# Patient Record
Sex: Male | Born: 1965 | Race: White | Hispanic: No | Smoking: Never smoker
Health system: Southern US, Community
[De-identification: ages and names within clinical notes are randomized; demographics above are authoritative.]

## PROBLEM LIST (undated history)

## (undated) DIAGNOSIS — I10 Essential (primary) hypertension: Secondary | ICD-10-CM

## (undated) DIAGNOSIS — E039 Hypothyroidism, unspecified: Secondary | ICD-10-CM

## (undated) DIAGNOSIS — F419 Anxiety disorder, unspecified: Secondary | ICD-10-CM

## (undated) DIAGNOSIS — K219 Gastro-esophageal reflux disease without esophagitis: Secondary | ICD-10-CM

## (undated) DIAGNOSIS — G4730 Sleep apnea, unspecified: Secondary | ICD-10-CM

## (undated) DIAGNOSIS — F32A Depression, unspecified: Secondary | ICD-10-CM

## (undated) DIAGNOSIS — F329 Major depressive disorder, single episode, unspecified: Secondary | ICD-10-CM

## (undated) DIAGNOSIS — R51 Headache: Secondary | ICD-10-CM

## (undated) HISTORY — PX: KNEE ARTHROSCOPY: SHX127

## (undated) HISTORY — PX: CARDIAC CATHETERIZATION: SHX172

## (undated) HISTORY — PX: OTHER SURGICAL HISTORY: SHX169

## (undated) HISTORY — PX: NASAL SEPTOPLASTY W/ TURBINOPLASTY: SHX2070

---

## 1998-02-10 ENCOUNTER — Emergency Department (HOSPITAL_COMMUNITY): Admission: EM | Admit: 1998-02-10 | Discharge: 1998-02-10 | Payer: Self-pay

## 1999-01-05 ENCOUNTER — Emergency Department (HOSPITAL_COMMUNITY): Admission: EM | Admit: 1999-01-05 | Discharge: 1999-01-05 | Payer: Self-pay | Admitting: Emergency Medicine

## 1999-04-08 ENCOUNTER — Ambulatory Visit (HOSPITAL_COMMUNITY): Admission: RE | Admit: 1999-04-08 | Discharge: 1999-04-08 | Payer: Self-pay | Admitting: Cardiovascular Disease

## 2001-03-17 ENCOUNTER — Encounter: Admission: RE | Admit: 2001-03-17 | Discharge: 2001-03-17 | Payer: Self-pay | Admitting: Family Medicine

## 2001-03-17 ENCOUNTER — Encounter: Payer: Self-pay | Admitting: Family Medicine

## 2005-11-19 ENCOUNTER — Encounter: Admission: RE | Admit: 2005-11-19 | Discharge: 2005-11-19 | Payer: Self-pay | Admitting: Family Medicine

## 2006-11-04 ENCOUNTER — Encounter: Admission: RE | Admit: 2006-11-04 | Discharge: 2006-11-04 | Payer: Self-pay | Admitting: Family Medicine

## 2007-01-08 ENCOUNTER — Encounter: Admission: RE | Admit: 2007-01-08 | Discharge: 2007-01-08 | Payer: Self-pay | Admitting: Otolaryngology

## 2007-03-10 ENCOUNTER — Ambulatory Visit (HOSPITAL_COMMUNITY): Admission: RE | Admit: 2007-03-10 | Discharge: 2007-03-11 | Payer: Self-pay | Admitting: Otolaryngology

## 2008-11-17 ENCOUNTER — Inpatient Hospital Stay (HOSPITAL_COMMUNITY): Admission: EM | Admit: 2008-11-17 | Discharge: 2008-11-18 | Payer: Self-pay | Admitting: Emergency Medicine

## 2010-04-08 ENCOUNTER — Ambulatory Visit (HOSPITAL_COMMUNITY)
Admission: RE | Admit: 2010-04-08 | Discharge: 2010-04-08 | Payer: Self-pay | Source: Home / Self Care | Attending: Psychiatry | Admitting: Psychiatry

## 2010-04-15 ENCOUNTER — Other Ambulatory Visit (HOSPITAL_COMMUNITY): Payer: BC Managed Care – PPO | Attending: Psychiatry | Admitting: Psychiatry

## 2010-04-15 DIAGNOSIS — F329 Major depressive disorder, single episode, unspecified: Secondary | ICD-10-CM | POA: Insufficient documentation

## 2010-04-15 DIAGNOSIS — F3289 Other specified depressive episodes: Secondary | ICD-10-CM | POA: Insufficient documentation

## 2010-04-15 DIAGNOSIS — I10 Essential (primary) hypertension: Secondary | ICD-10-CM | POA: Insufficient documentation

## 2010-04-15 DIAGNOSIS — F411 Generalized anxiety disorder: Secondary | ICD-10-CM | POA: Insufficient documentation

## 2010-04-15 DIAGNOSIS — E7800 Pure hypercholesterolemia, unspecified: Secondary | ICD-10-CM | POA: Insufficient documentation

## 2010-04-15 DIAGNOSIS — E039 Hypothyroidism, unspecified: Secondary | ICD-10-CM | POA: Insufficient documentation

## 2010-04-16 ENCOUNTER — Other Ambulatory Visit (HOSPITAL_COMMUNITY): Payer: BC Managed Care – PPO | Admitting: Psychiatry

## 2010-04-16 DIAGNOSIS — F3289 Other specified depressive episodes: Secondary | ICD-10-CM

## 2010-04-16 DIAGNOSIS — F329 Major depressive disorder, single episode, unspecified: Secondary | ICD-10-CM

## 2010-04-16 DIAGNOSIS — F411 Generalized anxiety disorder: Secondary | ICD-10-CM

## 2010-04-17 ENCOUNTER — Other Ambulatory Visit (HOSPITAL_COMMUNITY): Payer: BC Managed Care – PPO | Admitting: Psychiatry

## 2010-04-18 ENCOUNTER — Other Ambulatory Visit (HOSPITAL_COMMUNITY): Payer: BC Managed Care – PPO | Admitting: Psychiatry

## 2010-04-19 ENCOUNTER — Other Ambulatory Visit (HOSPITAL_COMMUNITY): Payer: BC Managed Care – PPO | Admitting: Psychiatry

## 2010-04-22 ENCOUNTER — Other Ambulatory Visit (HOSPITAL_COMMUNITY): Payer: BC Managed Care – PPO | Admitting: Psychiatry

## 2010-04-23 ENCOUNTER — Other Ambulatory Visit (HOSPITAL_COMMUNITY): Payer: BC Managed Care – PPO | Admitting: Psychiatry

## 2010-04-24 ENCOUNTER — Other Ambulatory Visit (HOSPITAL_COMMUNITY): Payer: BC Managed Care – PPO | Admitting: Psychiatry

## 2010-04-25 ENCOUNTER — Other Ambulatory Visit (HOSPITAL_COMMUNITY): Payer: BC Managed Care – PPO | Admitting: Psychiatry

## 2010-04-26 ENCOUNTER — Other Ambulatory Visit (HOSPITAL_COMMUNITY): Payer: BC Managed Care – PPO | Admitting: Psychiatry

## 2010-04-29 ENCOUNTER — Other Ambulatory Visit (HOSPITAL_COMMUNITY): Payer: BC Managed Care – PPO | Admitting: Psychiatry

## 2010-04-30 ENCOUNTER — Other Ambulatory Visit (HOSPITAL_COMMUNITY): Payer: BC Managed Care – PPO | Admitting: Psychiatry

## 2010-05-01 ENCOUNTER — Other Ambulatory Visit (HOSPITAL_COMMUNITY): Payer: BC Managed Care – PPO | Admitting: Psychiatry

## 2010-05-02 ENCOUNTER — Other Ambulatory Visit (HOSPITAL_COMMUNITY): Payer: BC Managed Care – PPO | Admitting: Psychiatry

## 2010-05-03 ENCOUNTER — Other Ambulatory Visit (HOSPITAL_COMMUNITY): Payer: BC Managed Care – PPO | Admitting: Psychiatry

## 2010-06-14 LAB — TYPE AND SCREEN

## 2010-06-14 LAB — CBC
HCT: 34.8 % — ABNORMAL LOW (ref 39.0–52.0)
Hemoglobin: 12 g/dL — ABNORMAL LOW (ref 13.0–17.0)
RBC: 3.7 MIL/uL — ABNORMAL LOW (ref 4.22–5.81)
WBC: 14.8 10*3/uL — ABNORMAL HIGH (ref 4.0–10.5)

## 2010-06-14 LAB — BASIC METABOLIC PANEL
BUN: 17 mg/dL (ref 6–23)
GFR calc Af Amer: >60 mL/min (ref >60)
GFR calc non Af Amer: >60 mL/min (ref >60)
Potassium: 3.9 mEq/L (ref 3.5–5.1)
Sodium: 137 mEq/L (ref 135–145)

## 2010-06-14 LAB — DIFFERENTIAL
Basophils Absolute: 0 10*3/uL (ref 0.0–0.1)
Eosinophils Relative: 1 % (ref 0–5)
Lymphocytes Relative: 12 % (ref 12–46)
Lymphs Abs: 1.8 10*3/uL (ref 0.7–4.0)
Monocytes Absolute: 1.5 10*3/uL — ABNORMAL HIGH (ref 0.1–1.0)
Monocytes Relative: 10 % (ref 3–12)
Neutro Abs: 11.4 10*3/uL — ABNORMAL HIGH (ref 1.7–7.7)

## 2010-06-14 LAB — ABO/RH: ABO/RH(D): O POS

## 2010-07-23 NOTE — Op Note (Signed)
NAME:  [name], [name] NO.:  [id number]   MEDICAL RECORD NO.:  [id number]          PATIENT TYPE:  AMB   LOCATION:  SDS                          FACILITY:  [facility]   PHYSICIAN:  Kinnie Scales. Annalee Genta, M.D.DATE OF BIRTH:  30-Mar-1965   DATE OF PROCEDURE:  03/10/2007  DATE OF DISCHARGE:                               OPERATIVE REPORT   PREOPERATIVE DIAGNOSIS:  1. Deviated nasal septum with nasal airway obstruction.  2. Inferior turbinate hypertrophy.  3. Obstructive sleep apnea.   POSTOPERATIVE DIAGNOSIS:  1. Deviated nasal septum with nasal airway obstruction.  2. Inferior turbinate hypertrophy.  3. Obstructive sleep apnea.   SURGICAL PROCEDURES:  1. Nasal septoplasty.  2. Bilateral inferior turbinate reduction.   SURGEON:  Kinnie Scales. Annalee Genta, M.D.   ANESTHESIA:  General endotracheal.   COMPLICATIONS:  None.   ESTIMATED BLOOD LOSS:  Less than 50 mL.   DISPOSITION:  The patient was transferred from the operating room to the  recovery room in stable condition.   BRIEF HISTORY:  Mr. Mark Good is a 45 year old white male who is referred for  evaluation of severe obstructive sleep apnea.  He was found to have a  deviated nasal septum with nasal airway obstruction and bilateral  turbinate hypertrophy.  He has been attempting to wear CPAP on an  ongoing basis, but is having significant difficulty with nasal pressure  and congestion.  Given his history and physical findings, I recommended  he undertake nasal septoplasty and turbinate reduction.  The risks,  benefits, and possible complications of the surgical procedure were  discussed in detail with the patient who understood and concurred with  our plan for surgery which was scheduled as an outpatient under general  anesthesia at [facility] on March 10, 2007. The patient was  planned surgery in the main OR with overnight observation in the step  down unit for monitoring of sleep apnea.  The patient and his  family  understood and concurred with our plan which is scheduled as above.   PROCEDURE:  The patient was brought to the operating room on March 10, 2007, and placed in the supine position on the operating table.  General endotracheal anesthesia was established without difficulty.  When the patient was adequately anesthetized, her was positioned on the  operating room table and prepped and draped in a sterile fashion.  His  nose was injected with 8 mL of 1% lidocaine with 1:100,000 epinephrine,  injected in a submucosal fashion along the nasal septum and inferior  turbinates bilaterally.  The patient's nose was then packed with Afrin  soaked cottonoid pledgets which were left in place for approximately ten  minutes allowing for vasoconstriction and hemostasis.  The patient was  then positioned and draped and the surgical procedure was begun.   The packing was removed and a right anterior hemitransfixion incision  was created. A mucoperichondrial flap was elevated along the patient's  right hand side. The bony cartilaginous junction was crossed and a  mucoperiosteal flap was elevated along the patient's left hand side.  Deviated bone and cartilage were then  mobilized and removed in the mid  and posterior aspects of the nasal septum.  The patient had a large  nasal septal spur which was mobilized with a 4 mm osteotome, preserving  the overlying mucosa. Anterior dorsal and columellar cartilage was not  deviated and was left intact.  The mid septal cartilage was morselized  and returned to the mucoperichondrial pocket and the flaps were  reapproximated with a 4-0 gut suture on a Keith needle in horizontal  mattress fashion.  Bilateral Doyle nasal septal splints were then placed  after the application of Bactroban ointment and were sutured in position  with a 3-0 Ethilon suture.   Attention was then turned to the inferior turbinates where bilateral  inferior turbinate reduction was  performed. With bipolar intramural  cautery set at 12 watts, two submucosal passes were made in each  inferior turbinate.  When the turbinates had been adequately cauterized,  they were outfractured creating a more patent nasal cavity. Small  anterior incisions were created and the anterior turbinate bone was  resected preserving the overlying mucosa.  The nasal cavity was then  irrigated and suctioned.  An orogastric tube was passed and the stomach  contents were aspirated.  The patient was then awakened from his  anesthetic, he was extubated and transferred from the operating room to  the recovery room in stable condition.  No complications.  Blood loss  less than 50 mL.           ______________________________  Kinnie Scales. Annalee Genta, M.D.     DLS/MEDQ  D:  57/[id number]  T:  03/10/2007  Job:  [id number]

## 2010-12-13 LAB — BASIC METABOLIC PANEL
Chloride: 101
Creatinine, Ser: 1.01
GFR calc Af Amer: >60
Sodium: 136

## 2010-12-13 LAB — CBC
Hemoglobin: 11.4 — ABNORMAL LOW
MCV: 77.1 — ABNORMAL LOW
RBC: 4.42
WBC: 6.1

## 2011-02-12 NOTE — H&P (Signed)
  [facility] [address]   [address] [place], [place] [address] [phone] A Division of [facility]  Loreta Ave, M.D.     Robert A. Thurston Hole, M.D.     Lunette Stands, M.D. Eulas Post, M.D.    Buford Dresser, M.D. Estell Harpin, M.D. Ralene Cork, D.O.          Genene Churn. Barry Dienes, PA-C            Kirstin A. Shepperson, PA-C Janace Litten, OPA-C   RE: Mark Good, Mark Good   [id number]      DOB: 1965/09/01 PROGRESS NOTE: 01-28-11 45 year old white male seen at request of Dr. Charlett Blake for his right knee. He has worsening right knee pain locking and catching since February. Temporary improvement with Depo-Medrol/Marcaine injection. MRI on 11/10 showed mild tricompartmental degenerative changes intact ligamentous structures no bony findings, complex peripheral tear posterior horn of the lateral meniscus and moderate sized Baker's cyst. He's ready to schedule arthroscopy with debridement.  EXAMINATION: Alert and oriented x3 in no acute distress. Gait is somewhat antalgic. Right knee good range of motion some swelling with 1+ effusion. Lateral greater than medial joint line tenderness.  Positive McMurray's. Cruciate and collateral ligaments stable. Calf non-tender neurovascularly intact.  Skin warm and dry. No increase in respiratory effort.   IMPRESSION: Right knee pain and mechanical symptoms secondary to lateral meniscus tear.  DISPOSITION: We'll schedule left knee arthroscopy with debridement and partial lateral meniscectomy. Discussed risks benefits and possible complications in detail. Recommended he be out of work for 3-4 weeks if possible. He asks to return sooner but I advise against this. All questions answered.  Loreta Ave, M.D.  Electronically verified by Loreta Ave, M.D. DFM(JMO):kh D 02-03-11 T 02-03-11

## 2011-02-13 ENCOUNTER — Ambulatory Visit (HOSPITAL_BASED_OUTPATIENT_CLINIC_OR_DEPARTMENT_OTHER): Payer: BC Managed Care – PPO | Admitting: Anesthesiology

## 2011-02-13 ENCOUNTER — Encounter (HOSPITAL_BASED_OUTPATIENT_CLINIC_OR_DEPARTMENT_OTHER): Payer: Self-pay | Admitting: Anesthesiology

## 2011-02-13 ENCOUNTER — Ambulatory Visit (HOSPITAL_BASED_OUTPATIENT_CLINIC_OR_DEPARTMENT_OTHER)
Admission: RE | Admit: 2011-02-13 | Discharge: 2011-02-13 | Disposition: A | Discharge status: Home / Self Care | Payer: BC Managed Care – PPO | Source: Ambulatory Visit | Attending: Orthopedic Surgery | Admitting: Orthopedic Surgery

## 2011-02-13 ENCOUNTER — Other Ambulatory Visit: Payer: Self-pay

## 2011-02-13 ENCOUNTER — Encounter (HOSPITAL_BASED_OUTPATIENT_CLINIC_OR_DEPARTMENT_OTHER)
Admission: RE | Disposition: A | Discharge status: Home / Self Care | Payer: Self-pay | Source: Ambulatory Visit | Attending: Orthopedic Surgery

## 2011-02-13 ENCOUNTER — Encounter (HOSPITAL_BASED_OUTPATIENT_CLINIC_OR_DEPARTMENT_OTHER): Payer: Self-pay | Admitting: *Deleted

## 2011-02-13 ENCOUNTER — Encounter (HOSPITAL_BASED_OUTPATIENT_CLINIC_OR_DEPARTMENT_OTHER): Payer: Self-pay | Admitting: Orthopedic Surgery

## 2011-02-13 DIAGNOSIS — M23359 Other meniscus derangements, posterior horn of lateral meniscus, unspecified knee: Secondary | ICD-10-CM | POA: Insufficient documentation

## 2011-02-13 DIAGNOSIS — Z4789 Encounter for other orthopedic aftercare: Secondary | ICD-10-CM

## 2011-02-13 HISTORY — DX: Major depressive disorder, single episode, unspecified: F32.9

## 2011-02-13 HISTORY — DX: Anxiety disorder, unspecified: F41.9

## 2011-02-13 HISTORY — DX: Sleep apnea, unspecified: G47.30

## 2011-02-13 HISTORY — DX: Depression, unspecified: F32.A

## 2011-02-13 HISTORY — DX: Hypothyroidism, unspecified: E03.9

## 2011-02-13 HISTORY — DX: Essential (primary) hypertension: I10

## 2011-02-13 LAB — POCT I-STAT, CHEM 8
Chloride: 99 mEq/L (ref 96–112)
Glucose, Bld: 119 mg/dL — ABNORMAL HIGH (ref 70–99)
HCT: 45 % (ref 39.0–52.0)
Potassium: 3.6 mEq/L (ref 3.5–5.1)

## 2011-02-13 SURGERY — ARTHROSCOPY, KNEE, WITH MEDIAL MENISCECTOMY
Anesthesia: General | Site: Knee | Laterality: Right | Wound class: Clean

## 2011-02-13 MED ORDER — LACTATED RINGERS IV SOLN
INTRAVENOUS | Status: DC
  Administered 2011-02-13: 07:00:00 via INTRAVENOUS

## 2011-02-13 MED ORDER — PROMETHAZINE HCL 25 MG/ML IJ SOLN: 6.2500 mg | INTRAMUSCULAR | Status: DC | PRN

## 2011-02-13 MED ORDER — FENTANYL CITRATE 0.05 MG/ML IJ SOLN
INTRAMUSCULAR | Status: DC | PRN
  Administered 2011-02-13: 100 ug via INTRAVENOUS

## 2011-02-13 MED ORDER — DEXAMETHASONE SODIUM PHOSPHATE 4 MG/ML IJ SOLN
INTRAMUSCULAR | Status: DC | PRN
  Administered 2011-02-13: 10 mg via INTRAVENOUS

## 2011-02-13 MED ORDER — KETOROLAC TROMETHAMINE 30 MG/ML IJ SOLN
INTRAMUSCULAR | Status: DC | PRN
  Administered 2011-02-13: 30 mg via INTRAVENOUS

## 2011-02-13 MED ORDER — FENTANYL CITRATE 0.05 MG/ML IJ SOLN: 25.0000 ug | INTRAMUSCULAR | Status: DC | PRN

## 2011-02-13 MED ORDER — SUCCINYLCHOLINE CHLORIDE 20 MG/ML IJ SOLN
INTRAMUSCULAR | Status: DC | PRN
  Administered 2011-02-13: 100 mg via INTRAVENOUS

## 2011-02-13 MED ORDER — MIDAZOLAM HCL 2 MG/2ML IJ SOLN: 0.5000 mg | INTRAMUSCULAR | Status: DC | PRN

## 2011-02-13 MED ORDER — METHYLPREDNISOLONE ACETATE 80 MG/ML IJ SUSP
INTRAMUSCULAR | Status: DC | PRN
  Administered 2011-02-13: 80 mg via INTRA_ARTICULAR

## 2011-02-13 MED ORDER — MEPERIDINE HCL 25 MG/ML IJ SOLN: 6.2500 mg | INTRAMUSCULAR | Status: DC | PRN

## 2011-02-13 MED ORDER — LIDOCAINE HCL (CARDIAC) 20 MG/ML IV SOLN
INTRAVENOUS | Status: DC | PRN
  Administered 2011-02-13: 50 mg via INTRAVENOUS

## 2011-02-13 MED ORDER — MIDAZOLAM HCL 5 MG/5ML IJ SOLN
INTRAMUSCULAR | Status: DC | PRN
  Administered 2011-02-13: 2 mg via INTRAVENOUS

## 2011-02-13 MED ORDER — BUPIVACAINE HCL (PF) 0.5 % IJ SOLN
INTRAMUSCULAR | Status: DC | PRN
  Administered 2011-02-13: 20 mL

## 2011-02-13 MED ORDER — ONDANSETRON HCL 4 MG/2ML IJ SOLN
INTRAMUSCULAR | Status: DC | PRN
  Administered 2011-02-13: 4 mg via INTRAVENOUS

## 2011-02-13 MED ORDER — CEFAZOLIN SODIUM 1-5 GM-% IV SOLN
1.0000 g | INTRAVENOUS | Status: DC
  Administered 2011-02-13: 2 g via INTRAVENOUS

## 2011-02-13 MED ORDER — CHLORHEXIDINE GLUCONATE 4 % EX LIQD: 60.0000 mL | Freq: Once | CUTANEOUS | Status: DC

## 2011-02-13 MED ORDER — SODIUM CHLORIDE 0.9 % IR SOLN
Status: DC | PRN
  Administered 2011-02-13: 3000 mL

## 2011-02-13 MED ORDER — CEFAZOLIN SODIUM-DEXTROSE 2-3 GM-% IV SOLR: 2.0000 g | INTRAVENOUS | Status: DC

## 2011-02-13 MED ORDER — FENTANYL CITRATE 0.05 MG/ML IJ SOLN
50.0000 ug | INTRAMUSCULAR | Status: DC | PRN
  Administered 2011-02-13: 50 ug via INTRAVENOUS

## 2011-02-13 MED ORDER — PROPOFOL 10 MG/ML IV EMUL
INTRAVENOUS | Status: DC | PRN
  Administered 2011-02-13: 200 mg via INTRAVENOUS
  Administered 2011-02-13 (×2): 50 mg via INTRAVENOUS

## 2011-02-13 SURGICAL SUPPLY — 41 items
BANDAGE ELASTIC 6 VELCRO ST LF (GAUZE/BANDAGES/DRESSINGS) ×2 IMPLANT
BLADE CUDA 5.5 (BLADE) IMPLANT
BLADE CUDA GRT WHITE 3.5 (BLADE) IMPLANT
BLADE CUTTER GATOR 3.5 (BLADE) ×2 IMPLANT
BLADE CUTTER MENIS 5.5 (BLADE) IMPLANT
BLADE GREAT WHITE 4.2 (BLADE) ×2 IMPLANT
BUR OVAL 4.0 (BURR) IMPLANT
CANISTER OMNI JUG 16 LITER (MISCELLANEOUS) ×2 IMPLANT
CANISTER SUCTION 2500CC (MISCELLANEOUS) IMPLANT
CLOTH BEACON ORANGE TIMEOUT ST (SAFETY) ×2 IMPLANT
CUTTER MENISCUS  4.2MM (BLADE)
CUTTER MENISCUS 4.2MM (BLADE) IMPLANT
DRAPE ARTHROSCOPY W/POUCH 90 (DRAPES) ×2 IMPLANT
DRSG PAD ABDOMINAL 8X10 ST (GAUZE/BANDAGES/DRESSINGS) ×1 IMPLANT
DURAPREP 26ML APPLICATOR (WOUND CARE) ×2 IMPLANT
ELECT MENISCUS 165MM 90D (ELECTRODE) ×1 IMPLANT
ELECT REM PT RETURN 9FT ADLT (ELECTROSURGICAL) ×2
ELECTRODE REM PT RTRN 9FT ADLT (ELECTROSURGICAL) IMPLANT
GAUZE XEROFORM 1X8 LF (GAUZE/BANDAGES/DRESSINGS) ×2 IMPLANT
GLOVE BIO SURGEON STRL SZ 6.5 (GLOVE) ×2 IMPLANT
GLOVE BIOGEL PI IND STRL 7.0 (GLOVE) IMPLANT
GLOVE BIOGEL PI IND STRL 8 (GLOVE) ×1 IMPLANT
GLOVE BIOGEL PI INDICATOR 7.0 (GLOVE) ×1
GLOVE BIOGEL PI INDICATOR 8 (GLOVE) ×1
GLOVE ORTHO TXT STRL SZ7.5 (GLOVE) ×4 IMPLANT
GOWN BRE IMP PREV XXLGXLNG (GOWN DISPOSABLE) ×2 IMPLANT
GOWN PREVENTION PLUS XLARGE (GOWN DISPOSABLE) ×3 IMPLANT
HOLDER KNEE FOAM BLUE (MISCELLANEOUS) ×2 IMPLANT
KNEE WRAP E Z 3 GEL PACK (MISCELLANEOUS) ×1 IMPLANT
PACK ARTHROSCOPY DSU (CUSTOM PROCEDURE TRAY) ×2 IMPLANT
PACK BASIN DAY SURGERY FS (CUSTOM PROCEDURE TRAY) ×2 IMPLANT
PADDING WEBRIL 4 STERILE (GAUZE/BANDAGES/DRESSINGS) ×1 IMPLANT
PENCIL BUTTON HOLSTER BLD 10FT (ELECTRODE) ×1 IMPLANT
SET ARTHROSCOPY TUBING (MISCELLANEOUS) ×2
SET ARTHROSCOPY TUBING LN (MISCELLANEOUS) ×1 IMPLANT
SPONGE GAUZE 4X4 12PLY (GAUZE/BANDAGES/DRESSINGS) ×4 IMPLANT
SPONGE GAUZE 4X4 16PLY NONSTR (GAUZE/BANDAGES/DRESSINGS) ×1 IMPLANT
SUT ETHILON 3 0 PS 1 (SUTURE) ×2 IMPLANT
SUT VIC AB 3-0 FS2 27 (SUTURE) IMPLANT
TOWEL OR 17X24 6PK STRL BLUE (TOWEL DISPOSABLE) ×2 IMPLANT
WATER STERILE IRR 1000ML POUR (IV SOLUTION) ×2 IMPLANT

## 2011-02-13 NOTE — Anesthesia Procedure Notes (Addendum)
Procedure Name: LMA Insertion Performed by: Sharyne Richters Pre-anesthesia Checklist: Patient identified, Timeout performed, Emergency Drugs available, Suction available and Patient being monitored Patient Re-evaluated:Patient Re-evaluated prior to inductionOxygen Delivery Method: Circle System Utilized Preoxygenation: Pre-oxygenation with 100% oxygen Intubation Type: IV induction Ventilation: Mask ventilation with difficulty LMA: LMA inserted and LMA with gastric port inserted LMA Size: 4.0 Number of attempts: 1 Placement Confirmation: breath sounds checked- equal and bilateral and positive ETCO2 Dental Injury: Teeth and Oropharynx as per pre-operative assessment     Procedure Name: Intubation Performed by: Sharyne Richters Preoxygenation: Pre-oxygenation with 100% oxygen Grade View: Grade I Tube type: Oral Number of attempts: 1 Airway Equipment and Method: video-laryngoscopy Placement Confirmation: ETT inserted through vocal cords under direct vision,  breath sounds checked- equal and bilateral and positive ETCO2 Dental Injury: Teeth and Oropharynx as per pre-operative assessment     Performed by: Sharyne Richters Secured at: 23 cm Tube secured with: Tape

## 2011-02-13 NOTE — Anesthesia Preprocedure Evaluation (Signed)
Anesthesia Evaluation  Patient identified by MRN, date of birth, ID band Patient awake    Reviewed: Allergy & Precautions, H&P , NPO status , Patient's Chart, lab work & pertinent test results  Airway Mallampati: III TM Distance: >3 FB Neck ROM: full    Dental No notable dental hx. (+) Teeth Intact   Pulmonary neg pulmonary ROS, sleep apnea and Continuous Positive Airway Pressure Ventilation ,  clear to auscultation  Pulmonary exam normal       Cardiovascular hypertension, On Medications neg cardio ROS regular Normal    Neuro/Psych Negative Neurological ROS  Negative Psych ROS   GI/Hepatic negative GI ROS, Neg liver ROS,   Endo/Other  Negative Endocrine ROS  Renal/GU negative Renal ROS  Genitourinary negative   Musculoskeletal   Abdominal   Peds  Hematology negative hematology ROS (+)   Anesthesia Other Findings   Reproductive/Obstetrics negative OB ROS                           Anesthesia Physical Anesthesia Plan  ASA: III  Anesthesia Plan: General   Post-op Pain Management:    Induction: Intravenous  Airway Management Planned: LMA  Additional Equipment:   Intra-op Plan:   Post-operative Plan: Extubation in OR  Informed Consent: I have reviewed the patients History and Physical, chart, labs and discussed the procedure including the risks, benefits and alternatives for the proposed anesthesia with the patient or authorized representative who has indicated his/her understanding and acceptance.     Plan Discussed with: CRNA  Anesthesia Plan Comments:         Anesthesia Quick Evaluation

## 2011-02-13 NOTE — Interval H&P Note (Signed)
History and Physical Interval Note:  02/13/2011 7:40 AM  Mark Good  has presented today for surgery, with the diagnosis of MEDIAL MENISCUS TEAR  The various methods of treatment have been discussed with the patient and family. After consideration of risks, benefits and other options for treatment, the patient has consented to  Procedure(s): KNEE ARTHROSCOPY WITH MEDIAL MENISECTOMY as a surgical intervention .  The patients' history has been reviewed, patient examined, no change in status, stable for surgery.  I have reviewed the patients' chart and labs.  Questions were answered to the patient's satisfaction.     Kye Hedden F

## 2011-02-13 NOTE — Transfer of Care (Signed)
Immediate Anesthesia Transfer of Care Note  Patient: Mark Good  Procedure(s) Performed:  KNEE ARTHROSCOPY WITH MEDIAL MENISECTOMY - RIGHT KNEE ARTHROSCOPY WITH LATERAL MENISECTOMY excsion of plica  Patient Location: PACU  Anesthesia Type: General  Level of Consciousness: awake and alert   Airway & Oxygen Therapy: Patient Spontanous Breathing and Patient connected to face mask oxygen  Post-op Assessment: Report given to PACU RN and Post -op Vital signs reviewed and stable  Post vital signs: Reviewed and stable  Complications: No apparent anesthesia complications

## 2011-02-13 NOTE — Brief Op Note (Signed)
02/13/2011  8:45 AM  PATIENT:  Mark Good  45 y.o. male  PRE-OPERATIVE DIAGNOSIS:  LATERAL MENISCUS TEAR right knee  POST-OPERATIVE DIAGNOSIS:  same as preop plus plica  PROCEDURE:  Procedure(s): Right KNEE ARTHROSCOPY WITH MEDIAL MENISECTOMY  SURGEON:  Surgeon(s): Loreta Ave, MD  PHYSICIAN ASSISTANT: Zonia Kief M     ANESTHESIA:   general  EBL:  Total I/O In: 1000 [I.V.:1000] Out: -    SPECIMEN:  No Specimen  DISPOSITION OF SPECIMEN:  N/A  COUNTS:  YES  TOURNIQUET:  * No tourniquets in log *    PATIENT DISPOSITION:  PACU - hemodynamically stable.

## 2011-02-13 NOTE — Interval H&P Note (Signed)
History and Physical Interval Note:  02/13/2011 7:49 AM  Mark Good  has presented today for surgery, with the diagnosis of MEDIAL MENISCUS TEAR  The various methods of treatment have been discussed with the patient and family. After consideration of risks, benefits and other options for treatment, the patient has consented to  Procedure(s): KNEE ARTHROSCOPY WITH MEDIAL MENISECTOMY as a surgical intervention .  The patients' history has been reviewed, patient examined, no change in status, stable for surgery.  I have reviewed the patients' chart and labs.  Questions were answered to the patient's satisfaction.     Harmon Bommarito F

## 2011-02-13 NOTE — Anesthesia Postprocedure Evaluation (Signed)
  Anesthesia Post-op Note  Patient: Mark Good  Procedure(s) Performed:  KNEE ARTHROSCOPY WITH MEDIAL MENISECTOMY - RIGHT KNEE ARTHROSCOPY WITH LATERAL MENISECTOMY excsion of plica  Patient Location: PACU  Anesthesia Type: General  Level of Consciousness: awake and alert   Airway and Oxygen Therapy: Patient Spontanous Breathing and Patient connected to face mask oxygen  Post-op Pain: none  Post-op Assessment: Post-op Vital signs reviewed, Patient's Cardiovascular Status Stable, Respiratory Function Stable, Patent Airway and No signs of Nausea or vomiting  Post-op Vital Signs: Reviewed and stable  Complications: No apparent anesthesia complications

## 2011-02-14 NOTE — Op Note (Signed)
NAME:  Mark Good, TOOKER NO.:  [id number]  MEDICAL RECORD NO.:  [id number]  LOCATION:                                 FACILITY:  PHYSICIAN:  Loreta Ave, M.D.      DATE OF BIRTH:  DATE OF PROCEDURE:  02/13/2011 DATE OF DISCHARGE:                              OPERATIVE REPORT   PREOPERATIVE DIAGNOSIS:  Right knee lateral meniscus tear.  POSTOPERATIVE DIAGNOSES:  Right knee lateral meniscus tear with also __________ fibrotic medial plica.  PROCEDURE:  Right knee exam under anesthesia, arthroscopy.  Partial lateral meniscectomy.  Excision medial plica.  SURGEON:  Loreta Ave, MD  ASSISTANT:  Zonia Kief, PA  ANESTHESIA:  General.  BLOOD LOSS:  Minimal.  SPECIMENS:  None.  CULTURES:  None.  COMPLICATION:  None.  DRESSINGS:  Sterile compressive.  TOURNIQUET:  Not employed.  PROCEDURE:  The patient was brought to the operating room and placed on the operating table in supine position.  After adequate anesthesia had been obtained, tourniquet and leg holder applied,  leg prepped and draped in usual sterile fashion.  Full motion, good stability.  Two portals, 1 each medial and lateral parapatellar.  Arthroscope was introduced, knee distended and inspected.  Large fibrotic medial plica extending halfway across patellofemoral joint with chondral changes on the condyle below.  Plica excised, chondroplasty on the margin of the condyle.  Hemostasis with cautery.  Medial meniscus and medial compartment normal.  ACL some attenuation stretching but intact. Lateral meniscus extensive complex tearing entire posterior half, nothing reparable.  Posterior half removed, tapered in remaining meniscus.  The entire knee examined and no other findings appreciated.  Instruments and fluid were removed.  Portals of knee injected with Marcaine.  Portals closed with 4-0 nylon.  Sterile compressive dressing applied.  Anesthesia reversed.  Brought to the recovery room.   Tolerated the surgery well.  No complications.     Loreta Ave, M.D.     DFM/MEDQ  D:  02/13/2011  T:  02/13/2011  Job:  [id number]

## 2011-11-03 ENCOUNTER — Encounter (HOSPITAL_COMMUNITY): Payer: Self-pay | Admitting: Family Medicine

## 2011-11-03 ENCOUNTER — Emergency Department (HOSPITAL_COMMUNITY)
Admission: EM | Admit: 2011-11-03 | Discharge: 2011-11-04 | Disposition: A | Discharge status: Home / Self Care | Payer: BC Managed Care – PPO | Attending: Emergency Medicine | Admitting: Emergency Medicine

## 2011-11-03 ENCOUNTER — Emergency Department (HOSPITAL_COMMUNITY): Payer: BC Managed Care – PPO

## 2011-11-03 DIAGNOSIS — F29 Unspecified psychosis not due to a substance or known physiological condition: Secondary | ICD-10-CM | POA: Insufficient documentation

## 2011-11-03 DIAGNOSIS — R0602 Shortness of breath: Secondary | ICD-10-CM | POA: Insufficient documentation

## 2011-11-03 DIAGNOSIS — Z79899 Other long term (current) drug therapy: Secondary | ICD-10-CM | POA: Insufficient documentation

## 2011-11-03 DIAGNOSIS — R51 Headache: Secondary | ICD-10-CM | POA: Insufficient documentation

## 2011-11-03 DIAGNOSIS — R5381 Other malaise: Secondary | ICD-10-CM | POA: Insufficient documentation

## 2011-11-03 DIAGNOSIS — R079 Chest pain, unspecified: Secondary | ICD-10-CM | POA: Insufficient documentation

## 2011-11-03 DIAGNOSIS — F3289 Other specified depressive episodes: Secondary | ICD-10-CM | POA: Insufficient documentation

## 2011-11-03 DIAGNOSIS — E039 Hypothyroidism, unspecified: Secondary | ICD-10-CM | POA: Insufficient documentation

## 2011-11-03 DIAGNOSIS — I10 Essential (primary) hypertension: Secondary | ICD-10-CM | POA: Insufficient documentation

## 2011-11-03 DIAGNOSIS — F329 Major depressive disorder, single episode, unspecified: Secondary | ICD-10-CM | POA: Insufficient documentation

## 2011-11-03 LAB — COMPREHENSIVE METABOLIC PANEL
ALT: 25 U/L (ref 0–53)
Alkaline Phosphatase: 50 U/L (ref 39–117)
CO2: 27 mEq/L (ref 19–32)
Calcium: 10 mg/dL (ref 8.4–10.5)
GFR calc Af Amer: >90 mL/min (ref >90)
GFR calc non Af Amer: >90 mL/min (ref >90)
Glucose, Bld: 95 mg/dL (ref 70–99)
Sodium: 134 mEq/L — ABNORMAL LOW (ref 135–145)

## 2011-11-03 LAB — CBC
Hemoglobin: 13.5 g/dL (ref 13.0–17.0)
MCH: 30.1 pg (ref 26.0–34.0)
RBC: 4.49 MIL/uL (ref 4.22–5.81)

## 2011-11-03 LAB — POCT I-STAT TROPONIN I: Troponin i, poc: 0 ng/mL (ref 0.00–0.08)

## 2011-11-03 MED ORDER — IBUPROFEN 800 MG PO TABS
800.0000 mg | ORAL_TABLET | Freq: Once | ORAL | Status: AC
  Administered 2011-11-03: 800 mg via ORAL
  Filled 2011-11-03: qty 1

## 2011-11-03 NOTE — ED Provider Notes (Signed)
History     CSN: [id number]  Arrival date & time 11/03/11  1811   First MD Initiated Contact with Patient 11/03/11 2208      Chief Complaint  Patient presents with  . Chest Pain  . Altered Mental Status  . Headache   HPI  History provided by patient and family. Patient is a 46 year old male with history of hypertension, hypothyroidism, anxiety and depression who presents with multiple symptoms of chest pain, headache, general weakness and fatigue, confusion and memory problems, loss of interest. Patient states that symptoms began earlier this month on the 14th. Patient complains of intermittent chest heaviness and sharp pains. Pain most often occurs at night when lying down but also occasionally with activity. Chest pain symptoms can be associated with shortness of breath or heaviness with breathing. Patient also reports having increased stress with work which has caused him to be more depressed. Patient history of her major depression and has seen psychiatrists several times over the past 2 weeks. He was sent to the emergency room because of his multiple symptoms and complaints to rule out any medical problems. Patient has not had any changes in medications. He has been taking medicines normally as prescribed. Patient is not smoke tobacco or use alcohol. He denies any other drug use. Patient does state that he made appointment with cardiologist which is in September. He has never had stress tests or other cardiac evaluation. He does have family history of diabetes, psychiatric issues, hypertension and congestive heart failure.    Past Medical History  Diagnosis Date  . Hypertension   . Depression   . Anxiety   . Hypothyroidism   . Sleep apnea     Past Surgical History  Procedure Date  . Nasal septoplasty w/ turbinoplasty   . Left leg repair of laceration     History reviewed. No pertinent family history.  History  Substance Use Topics  . Smoking status: Never Smoker   .  Smokeless tobacco: Never Used  . Alcohol Use: No      Review of Systems  Constitutional: Positive for fatigue. Negative for fever and chills.  Respiratory: Positive for cough and shortness of breath.   Cardiovascular: Positive for chest pain. Negative for palpitations.       Feet and hand swelling  Gastrointestinal: Negative for nausea, vomiting, abdominal pain, diarrhea and constipation.  Neurological: Positive for headaches. Negative for dizziness, facial asymmetry, speech difficulty, weakness, light-headedness and numbness.  Psychiatric/Behavioral: Positive for confusion and dysphoric mood. Negative for suicidal ideas and disturbed wake/sleep cycle.    Allergies  Review of patient's allergies indicates no known allergies.  Home Medications   Current Outpatient Rx  Name Route Sig Dispense Refill  . ALPRAZOLAM 1 MG PO TABS Oral Take 1 mg by mouth 3 (three) times daily as needed. For stress/anxiety    . GOODY HEADACHE PO Oral Take 1 packet by mouth 4 (four) times daily as needed. For headache    . BC HEADACHE PO Oral Take 1 packet by mouth 4 (four) times daily as needed. For headache    . ATENOLOL-CHLORTHALIDONE 100-25 MG PO TABS Oral Take 1 tablet by mouth daily.      Marland Kitchen VITAMIN B COMPLEX PO Oral Take 1 tablet by mouth daily.    Marland Kitchen VITAMIN D PO Oral Take 2 capsules by mouth daily.    . DULOXETINE HCL 60 MG PO CPEP Oral Take 60 mg by mouth daily.     Marland Kitchen LEVOTHYROXINE SODIUM 112 MCG  PO TABS Oral Take 112 mcg by mouth daily.      Marland Kitchen NIACIN ER (ANTIHYPERLIPIDEMIC) 1000 MG PO TBCR Oral Take 1,500 mg by mouth at bedtime.      Marland Kitchen FISH OIL PO Oral Take 2 capsules by mouth every evening.    Marland Kitchen SIMVASTATIN 40 MG PO TABS Oral Take 40 mg by mouth at bedtime.       BP 130/73  Pulse 60  Temp 98.2 F (36.8 C) (Oral)  Resp 20  SpO2 97%  Physical Exam  Nursing note and vitals reviewed. Constitutional: He is oriented to person, place, and time. He appears well-developed and well-nourished. No  distress.  HENT:  Head: Normocephalic and atraumatic.  Eyes: Conjunctivae and EOM are normal. Pupils are equal, round, and reactive to light.  Neck: Normal range of motion. Neck supple.  Cardiovascular: Normal rate and regular rhythm.   No murmur heard. Pulmonary/Chest: Effort normal and breath sounds normal. No respiratory distress. He has no wheezes. He has no rales. He exhibits no tenderness.  Abdominal: Soft. There is no tenderness. There is no rebound and no guarding.  Musculoskeletal: Normal range of motion. He exhibits no edema and no tenderness.       No appreciable swelling of the feet or hands. Normal pulses. Normal movements and extremity exams.  Neurological: He is alert and oriented to person, place, and time. He has normal strength. No cranial nerve deficit or sensory deficit. Coordination normal.       Patient with some slow responses.  Skin: Skin is warm and dry. No rash noted. No erythema.  Psychiatric: His behavior is normal. His speech is delayed. Thought content is not paranoid. He exhibits a depressed mood. He expresses no homicidal and no suicidal ideation.    ED Course  Procedures   Results for orders placed during the hospital encounter of 11/03/11  CBC      Component Value Range   WBC 7.8  4.0 - 10.5 K/uL   RBC 4.49  4.22 - 5.81 MIL/uL   Hemoglobin 13.5  13.0 - 17.0 g/dL   HCT 30.8 (*) 65.7 - 84.6 %   MCV 86.2  78.0 - 100.0 fL   MCH 30.1  26.0 - 34.0 pg   MCHC 34.9  30.0 - 36.0 g/dL   RDW 96.2  95.2 - 84.1 %   Platelets 271  150 - 400 K/uL  COMPREHENSIVE METABOLIC PANEL      Component Value Range   Sodium 134 (*) 135 - 145 mEq/L   Potassium 4.0  3.5 - 5.1 mEq/L   Chloride 95 (*) 96 - 112 mEq/L   CO2 27  19 - 32 mEq/L   Glucose, Bld 95  70 - 99 mg/dL   BUN 10  6 - 23 mg/dL   Creatinine, Ser 3.24  0.50 - 1.35 mg/dL   Calcium 40.1  8.4 - 02.7 mg/dL   Total Protein 7.5  6.0 - 8.3 g/dL   Albumin 4.4  3.5 - 5.2 g/dL   AST 27  0 - 37 U/L   ALT 25  0 - 53  U/L   Alkaline Phosphatase 50  39 - 117 U/L   Total Bilirubin 0.2 (*) 0.3 - 1.2 mg/dL   GFR calc non Af Amer >90  >90 mL/min   GFR calc Af Amer >90  >90 mL/min  POCT I-STAT TROPONIN I      Component Value Range   Troponin i, poc 0.00  0.00 - 0.08  ng/mL   Comment 3           POCT I-STAT TROPONIN I      Component Value Range   Troponin i, poc 0.00  0.00 - 0.08 ng/mL   Comment 3                Dg Chest 2 View  11/03/2011  *RADIOLOGY REPORT*  Clinical Data: Chest pain and headache.  CHEST - 2 VIEW  Comparison: PA and lateral chest 03/08/2007.  Findings: Lungs are clear.  Heart size is normal.  No pneumothorax or pleural fluid.  IMPRESSION: Negative chest.   Original Report Authenticated By: Bernadene Bell. Maricela Curet, M.D.    Ct Head Wo Contrast  11/04/2011  *RADIOLOGY REPORT*  Clinical Data: Weakness.  CT HEAD WITHOUT CONTRAST  Technique:  Contiguous axial images were obtained from the base of the skull through the vertex without contrast.  Comparison: None.  Findings: There is no evidence of acute intracranial abnormality including infarction, hemorrhage, mass lesion, mass effect, midline shift or abnormal extra-axial fluid collection.  No hydrocephalus or pneumocephalus.  The calvarium is intact.  Imaged paranasal sinuses and mastoid air cells are clear.  IMPRESSION: Negative exam.   Original Report Authenticated By: Bernadene Bell. D'ALESSIO, M.D.      1. Chest pain   2. Depression       MDM  10:50PM patient seen and evaluated. Patient currently without chest pain symptoms. Does complain of mild headache, and slowed thinking. No previous history of CAD.  Patient with slight bradycardia of. Is on beta blocker.  Patient discussed with attending physician. Patient with atypical symptoms for ACS. Unremarkable EKG, troponin x2 chest x-ray. Patient also with unremarkable CT scan. Patient's symptoms may be consistent with increased depression. Patient does report feeling increased depression and  stress from work prior to the onset of symptoms. At this time it is felt patient able to return safely home and followup with cardiologist as planned as well as continued followup with psychiatrist.     Date: 11/03/2011  Rate: 59  Rhythm: sinus bradycardia  QRS Axis: normal  Intervals: normal  ST/T Wave abnormalities: nonspecific T wave changes  Conduction Disutrbances:none  Narrative Interpretation: Inverted T waves in leads III and aVF, unchanged  Old EKG Reviewed: unchanged from 02/13/2011    Angus Seller, PA 11/04/11 [phone]

## 2011-11-03 NOTE — ED Notes (Signed)
Pt sts he has had heaviness in his chest, ha, and weakness. sts started the 14th. Was seen by a psychiatrist and no dx made.

## 2011-11-04 ENCOUNTER — Encounter (HOSPITAL_COMMUNITY): Payer: Self-pay | Admitting: Radiology

## 2011-11-04 ENCOUNTER — Emergency Department (HOSPITAL_COMMUNITY): Payer: BC Managed Care – PPO

## 2011-11-04 NOTE — ED Notes (Signed)
The patient is AOx4 and comfortable with his discharge instructions. 

## 2011-11-04 NOTE — ED Provider Notes (Signed)
Medical screening examination/treatment/procedure(s) were performed by non-physician practitioner and as supervising physician I was immediately available for consultation/collaboration.  Juliet Rude. Rubin Payor, MD 11/04/11 [phone]

## 2011-11-04 NOTE — ED Notes (Signed)
Patient transported to CT 

## 2012-06-14 ENCOUNTER — Other Ambulatory Visit: Payer: Self-pay | Admitting: Orthopedic Surgery

## 2012-06-14 DIAGNOSIS — M545 Low back pain: Secondary | ICD-10-CM

## 2012-06-19 ENCOUNTER — Ambulatory Visit
Admission: RE | Admit: 2012-06-19 | Discharge: 2012-06-19 | Disposition: A | Discharge status: Home / Self Care | Payer: BC Managed Care – PPO | Source: Ambulatory Visit | Attending: Orthopedic Surgery | Admitting: Orthopedic Surgery

## 2012-06-19 DIAGNOSIS — M545 Low back pain: Secondary | ICD-10-CM

## 2012-09-17 NOTE — H&P (Signed)
History of Present Illness The patient is a 47 year old male who presents today for follow up of their back. The patient is being followed for their central back pain. The patient presents today following MRI.  Subjective Transcription  He returns today for a followup. Since I saw him last he continues to have episodic incontinence of bowel and bladder. He continues to have severe debilitating pain which has only gotten worse since the injection. His past treatment to date has been chiropractic treatment. Multiple various courses of physical therapy. Recent injection therapy. He has also seen a chiropractor in the past. Despite all of these, he continues to have significant debilitating back, buttock and bilateral leg pain, left worse than the right as well as having longstanding incontinence of bowel and bladder.  Allergies No Known Drug Allergies. 08/12/2012   Medication History Percocet (5-325MG  Tablet, 1-2 Tablet Oral every eight hours, Taken starting 08/12/2012) Active. Albuterol Sulfate ((2.5 MG/3ML)0.083% Nebulized Soln, Inhalation) Active. Atenolol (50MG  Tablet, Oral) Active. Synthroid ( Tablet, Oral) Active. Simvastatin (40MG  Tablet, Oral) Active. Gabapentin (300MG  Capsule, Oral) Active. Furosemide (20MG  Tablet, Oral) Active. Fluticasone Propionate (50MCG/ACT Suspension, Nasal) Active. Niacin ER (Antihyperlipidemic) (500MG  Tablet ER, Oral) Active. ClonazePAM (1MG  Tablet, Oral) Active. DULoxetine HCl (60MG  Capsule DR Part, Oral) Active. Pantoprazole Sodium (40MG  Tablet DR, Oral) Active. Super B Complex/Vitamin C ( Oral) Active. Fish Oil Active.   Objective Transcription  He is a pleasant gentleman who appears his stated age in no acute distress. He is alert and oriented times three. He has no significant changes in his clinical exam from my initial visit on 08-12-12. He has significant left dysesthesias in the L5-S1 distribution. No foot drop. No real hip,  knee or ankle pain with joint range of motion. Significant back pain with forward flexion and extension as well as lateral rotation. No shortness of breath or chest pain. The abdomen is soft and nontender. He does have a history of longstanding incontinence of bowel and bladder.    RADIOGRAPHS:  The new MRI shows no significant changes from the April 2012 MRI. He still has a prominent epidural fat signal at L5-S1 with marked degenerative disk disease. There is also the posterior subcutaneous edema secondary to the attempted epidural injection. 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) Current Plans l Risks of surgery include, but are not limited to: Death, stroke, paralysis, nerve root damage/injury, bleeding, blood clots, loss of bowel/bladder control, sexual dysfunction, retrograde ejaculation, hardware failure, or malposition, spinal fluid leak, adjacent segment disease, non-union, need for further surgery, ongoing or worse pain, injury to bladder, bowel and abdominal contents, infection and recurrent disc herniation  l We have gone over the risks and benefits of surgery, which include infection, bleeding, nerve damage, death, stroke, paralysis, failure to heal, need for further surgery, ongoing or worse pain, loss of fixation, need for further surgery, CSF leak, loss of bowel or bladder control, ongoing or worse pain.    Plans Transcription  At this point in time, having tried and failed various forms of conservative management and having debilitating pain, I do not see the value in restarting these modalities.    We had a long discussion about surgery and he would like to proceed with that. Both he and his wife were present for the dictation. They were made aware of the risks to include infection, bleeding, nerve damage, death, stroke, paralysis, failure to  heal, need  for further surgery, ongoing or worsening pain. Adjacent segment disease, hardware failure,nonunion, nerve damage, blood clots, loss of bowel and bladder control. All questions were encouraged and answered. We will get his primary care physician's clearance and go ahead and proceed with this in the very near future.

## 2012-09-20 ENCOUNTER — Encounter (HOSPITAL_COMMUNITY): Payer: Self-pay | Admitting: Pharmacy Technician

## 2012-09-21 NOTE — Pre-Procedure Instructions (Signed)
Mark Good  09/21/2012   Your procedure is scheduled on: Thursday, September 23, 2012  Report to [facility] at 5:30 AM.  Call this number if you have problems the morning of surgery: [phone]   Remember:   Do not eat food or drink liquids after midnight.   Take these medicines the morning of surgery with A SIP OF WATER:atenolol (TENORMIN) 100 MG tablet ,DULoxetine (CYMBALTA) 60 MG capsule, gabapentin (NEURONTIN) 600 MG tablet , levothyroxine (SYNTHROID, LEVOTHROID) 112 MCG tablet, pantoprazole (PROTONIX) 40 MG tablet if needed:ALPRAZolam (XANAX) 1 MG tablet for stress/anxiety Stop taking Aspirin, Coumadin, Plavix, Effient, and herbal medications (Omega-3 Fatty Acids (FISH OIL PO)  Do not take any NSAIDs ie: Ibuprofen, Advil, Naproxen or any medication containing Aspirin (Aspirin-Acetaminophen-Caffeine (GOODY HEADACHE PO),Aspirin-Salicylamide-Caffeine (BC HEADACHE PO)  Do not wear jewelry, make-up or nail polish.  Do not wear lotions, powders, or perfumes. You may wear deodorant.  Do not shave 48 hours prior to surgery. Men may shave face and neck.  Do not bring valuables to the hospital.  [facility] is not responsible  for any belongings or valuables.  Contacts, dentures or bridgework may not be worn into surgery.  Leave suitcase in the car. After surgery it may be brought to your room.  For patients admitted to the hospital, checkout time is 11:00 AM the day of discharge.   Patients discharged the day of surgery will not be allowed to drive home.  Name and phone number of your driver:   Special Instructions: Shower using CHG 2 nights before surgery and the night before surgery.  If you shower the day of surgery use CHG.  Use special wash - you have one bottle of CHG for all showers.  You should use approximately 1/3 of the bottle for each shower.   Please read over the following fact sheets that you were given: Pain Booklet, Coughing and Deep Breathing, Blood Transfusion  Information, MRSA Information and Surgical Site Infection Prevention

## 2012-09-22 ENCOUNTER — Encounter (HOSPITAL_COMMUNITY)
Admission: RE | Admit: 2012-09-22 | Discharge: 2012-09-22 | Disposition: A | Discharge status: Home / Self Care | Payer: BC Managed Care – PPO | Source: Ambulatory Visit | Attending: Orthopedic Surgery | Admitting: Orthopedic Surgery

## 2012-09-22 ENCOUNTER — Encounter (HOSPITAL_COMMUNITY): Payer: Self-pay

## 2012-09-22 HISTORY — DX: Headache: R51

## 2012-09-22 LAB — CBC
HCT: 36.2 % — ABNORMAL LOW (ref 39.0–52.0)
RDW: 13.4 % (ref 11.5–15.5)
WBC: 5.1 10*3/uL (ref 4.0–10.5)

## 2012-09-22 LAB — SURGICAL PCR SCREEN
MRSA, PCR: NEGATIVE
Staphylococcus aureus: NEGATIVE

## 2012-09-22 LAB — TYPE AND SCREEN
ABO/RH(D): O POS
Antibody Screen: NEGATIVE

## 2012-09-22 LAB — BASIC METABOLIC PANEL
Chloride: 96 mEq/L (ref 96–112)
Creatinine, Ser: 0.9 mg/dL (ref 0.50–1.35)
GFR calc Af Amer: >90 mL/min (ref >90)
Potassium: 4 mEq/L (ref 3.5–5.1)

## 2012-09-22 MED ORDER — DEXTROSE 5 % IV SOLN
3.0000 g | INTRAVENOUS | Status: AC
  Administered 2012-09-23: 3 g via INTRAVENOUS
  Filled 2012-09-22: qty 3000

## 2012-09-23 ENCOUNTER — Ambulatory Visit (HOSPITAL_COMMUNITY): Payer: BC Managed Care – PPO

## 2012-09-23 ENCOUNTER — Encounter (HOSPITAL_COMMUNITY): Payer: Self-pay | Admitting: Anesthesiology

## 2012-09-23 ENCOUNTER — Encounter (HOSPITAL_COMMUNITY): Payer: Self-pay | Admitting: *Deleted

## 2012-09-23 ENCOUNTER — Inpatient Hospital Stay (HOSPITAL_COMMUNITY): Payer: BC Managed Care – PPO

## 2012-09-23 ENCOUNTER — Inpatient Hospital Stay (HOSPITAL_COMMUNITY)
Admission: RE | Admit: 2012-09-23 | Discharge: 2012-09-25 | DRG: 755 | Disposition: A | Discharge status: Home / Self Care | Payer: BC Managed Care – PPO | Source: Ambulatory Visit | Attending: Orthopedic Surgery | Admitting: Orthopedic Surgery

## 2012-09-23 ENCOUNTER — Ambulatory Visit (HOSPITAL_COMMUNITY): Payer: BC Managed Care – PPO | Admitting: Anesthesiology

## 2012-09-23 ENCOUNTER — Encounter (HOSPITAL_COMMUNITY)
Admission: RE | Disposition: A | Discharge status: Home / Self Care | Payer: Self-pay | Source: Ambulatory Visit | Attending: Orthopedic Surgery

## 2012-09-23 DIAGNOSIS — F3289 Other specified depressive episodes: Secondary | ICD-10-CM | POA: Diagnosis present

## 2012-09-23 DIAGNOSIS — G4730 Sleep apnea, unspecified: Secondary | ICD-10-CM | POA: Diagnosis present

## 2012-09-23 DIAGNOSIS — M47817 Spondylosis without myelopathy or radiculopathy, lumbosacral region: Secondary | ICD-10-CM | POA: Diagnosis present

## 2012-09-23 DIAGNOSIS — R32 Unspecified urinary incontinence: Secondary | ICD-10-CM | POA: Diagnosis present

## 2012-09-23 DIAGNOSIS — K219 Gastro-esophageal reflux disease without esophagitis: Secondary | ICD-10-CM | POA: Diagnosis present

## 2012-09-23 DIAGNOSIS — F329 Major depressive disorder, single episode, unspecified: Secondary | ICD-10-CM | POA: Diagnosis present

## 2012-09-23 DIAGNOSIS — I10 Essential (primary) hypertension: Secondary | ICD-10-CM | POA: Diagnosis present

## 2012-09-23 DIAGNOSIS — M51379 Other intervertebral disc degeneration, lumbosacral region without mention of lumbar back pain or lower extremity pain: Principal | ICD-10-CM | POA: Diagnosis present

## 2012-09-23 DIAGNOSIS — F411 Generalized anxiety disorder: Secondary | ICD-10-CM | POA: Diagnosis present

## 2012-09-23 DIAGNOSIS — E039 Hypothyroidism, unspecified: Secondary | ICD-10-CM | POA: Diagnosis present

## 2012-09-23 DIAGNOSIS — M5137 Other intervertebral disc degeneration, lumbosacral region: Principal | ICD-10-CM | POA: Diagnosis present

## 2012-09-23 HISTORY — DX: Gastro-esophageal reflux disease without esophagitis: K21.9

## 2012-09-23 HISTORY — PX: LUMBAR FUSION: SHX111

## 2012-09-23 SURGERY — POSTERIOR LUMBAR FUSION 1 LEVEL
Anesthesia: General | Site: Back | Wound class: Clean

## 2012-09-23 MED ORDER — DIPHENHYDRAMINE HCL 12.5 MG/5ML PO ELIX: 12.5000 mg | ORAL_SOLUTION | Freq: Four times a day (QID) | ORAL | Status: DC | PRN

## 2012-09-23 MED ORDER — DIPHENHYDRAMINE HCL 50 MG/ML IJ SOLN: 12.5000 mg | Freq: Four times a day (QID) | INTRAMUSCULAR | Status: DC | PRN

## 2012-09-23 MED ORDER — SODIUM CHLORIDE 0.9 % IV SOLN: 250.0000 mL | INTRAVENOUS | Status: DC

## 2012-09-23 MED ORDER — SODIUM CHLORIDE 0.9 % IJ SOLN: 3.0000 mL | INTRAMUSCULAR | Status: DC | PRN

## 2012-09-23 MED ORDER — 0.9 % SODIUM CHLORIDE (POUR BTL) OPTIME
TOPICAL | Status: DC | PRN
  Administered 2012-09-23: 1000 mL

## 2012-09-23 MED ORDER — HEMOSTATIC AGENTS (NO CHARGE) OPTIME
TOPICAL | Status: DC | PRN
  Administered 2012-09-23: 1 via TOPICAL

## 2012-09-23 MED ORDER — SODIUM CHLORIDE 0.9 % IJ SOLN: 9.0000 mL | INTRAMUSCULAR | Status: DC | PRN

## 2012-09-23 MED ORDER — ONDANSETRON HCL 4 MG/2ML IJ SOLN: 4.0000 mg | Freq: Once | INTRAMUSCULAR | Status: DC | PRN

## 2012-09-23 MED ORDER — OXYCODONE HCL 5 MG PO TABS
10.0000 mg | ORAL_TABLET | ORAL | Status: DC | PRN
  Administered 2012-09-24 – 2012-09-25 (×5): 10 mg via ORAL
  Filled 2012-09-23 (×5): qty 2

## 2012-09-23 MED ORDER — PROPOFOL INFUSION 10 MG/ML OPTIME
INTRAVENOUS | Status: DC | PRN
  Administered 2012-09-23: 75 ug/kg/min via INTRAVENOUS

## 2012-09-23 MED ORDER — BUPIVACAINE-EPINEPHRINE PF 0.25-1:200000 % IJ SOLN
INTRAMUSCULAR | Status: AC
  Filled 2012-09-23: qty 30

## 2012-09-23 MED ORDER — LACTATED RINGERS IV SOLN
INTRAVENOUS | Status: DC | PRN
  Administered 2012-09-23: 09:00:00 via INTRAVENOUS

## 2012-09-23 MED ORDER — LIDOCAINE HCL (CARDIAC) 20 MG/ML IV SOLN
INTRAVENOUS | Status: DC | PRN
  Administered 2012-09-23: 60 mg via INTRAVENOUS

## 2012-09-23 MED ORDER — SUCCINYLCHOLINE CHLORIDE 20 MG/ML IJ SOLN
INTRAMUSCULAR | Status: DC | PRN
  Administered 2012-09-23: 140 mg via INTRAVENOUS

## 2012-09-23 MED ORDER — FENTANYL CITRATE 0.05 MG/ML IJ SOLN
INTRAMUSCULAR | Status: DC | PRN
  Administered 2012-09-23 (×3): 50 ug via INTRAVENOUS
  Administered 2012-09-23: 100 ug via INTRAVENOUS
  Administered 2012-09-23: 50 ug via INTRAVENOUS
  Administered 2012-09-23: 100 ug via INTRAVENOUS
  Administered 2012-09-23 (×2): 50 ug via INTRAVENOUS
  Administered 2012-09-23: 100 ug via INTRAVENOUS

## 2012-09-23 MED ORDER — DEXAMETHASONE SODIUM PHOSPHATE 4 MG/ML IJ SOLN
4.0000 mg | Freq: Once | INTRAMUSCULAR | Status: AC
  Administered 2012-09-23: 10 mg via INTRAVENOUS
  Filled 2012-09-23: qty 1

## 2012-09-23 MED ORDER — CEFAZOLIN SODIUM-DEXTROSE 2-3 GM-% IV SOLR
2.0000 g | Freq: Once | INTRAVENOUS | Status: AC
  Administered 2012-09-23: 2 g via INTRAVENOUS

## 2012-09-23 MED ORDER — ARTIFICIAL TEARS OP OINT
TOPICAL_OINTMENT | OPHTHALMIC | Status: DC | PRN
  Administered 2012-09-23: 1 via OPHTHALMIC

## 2012-09-23 MED ORDER — ONDANSETRON HCL 4 MG/2ML IJ SOLN
INTRAMUSCULAR | Status: DC | PRN
  Administered 2012-09-23: 4 mg via INTRAVENOUS

## 2012-09-23 MED ORDER — MORPHINE SULFATE (PF) 1 MG/ML IV SOLN
INTRAVENOUS | Status: AC
  Filled 2012-09-23: qty 25

## 2012-09-23 MED ORDER — LEVOTHYROXINE SODIUM 112 MCG PO TABS
112.0000 ug | ORAL_TABLET | Freq: Every day | ORAL | Status: DC
  Administered 2012-09-24 – 2012-09-25 (×2): 112 ug via ORAL
  Filled 2012-09-23 (×3): qty 1

## 2012-09-23 MED ORDER — EPHEDRINE SULFATE 50 MG/ML IJ SOLN
INTRAMUSCULAR | Status: DC | PRN
  Administered 2012-09-23 (×4): 10 mg via INTRAVENOUS

## 2012-09-23 MED ORDER — CEFAZOLIN SODIUM 1-5 GM-% IV SOLN
1.0000 g | Freq: Three times a day (TID) | INTRAVENOUS | Status: AC
  Administered 2012-09-23 (×2): 1 g via INTRAVENOUS
  Filled 2012-09-23 (×2): qty 50

## 2012-09-23 MED ORDER — DULOXETINE HCL 60 MG PO CPEP
60.0000 mg | ORAL_CAPSULE | Freq: Every day | ORAL | Status: DC
  Administered 2012-09-24 – 2012-09-25 (×2): 60 mg via ORAL
  Filled 2012-09-23 (×2): qty 1

## 2012-09-23 MED ORDER — MORPHINE SULFATE 2 MG/ML IJ SOLN: 1.0000 mg | INTRAMUSCULAR | Status: DC | PRN

## 2012-09-23 MED ORDER — ONDANSETRON HCL 4 MG/2ML IJ SOLN: 4.0000 mg | INTRAMUSCULAR | Status: DC | PRN

## 2012-09-23 MED ORDER — HYDROMORPHONE HCL PF 1 MG/ML IJ SOLN
INTRAMUSCULAR | Status: AC
  Filled 2012-09-23: qty 1

## 2012-09-23 MED ORDER — ATENOLOL 100 MG PO TABS
100.0000 mg | ORAL_TABLET | Freq: Every day | ORAL | Status: DC
  Administered 2012-09-24 – 2012-09-25 (×2): 100 mg via ORAL
  Filled 2012-09-23 (×2): qty 1

## 2012-09-23 MED ORDER — HYDROMORPHONE HCL PF 1 MG/ML IJ SOLN
0.2500 mg | INTRAMUSCULAR | Status: DC | PRN
  Administered 2012-09-23 (×2): 0.5 mg via INTRAVENOUS

## 2012-09-23 MED ORDER — BUPIVACAINE-EPINEPHRINE 0.25% -1:200000 IJ SOLN
INTRAMUSCULAR | Status: DC | PRN
  Administered 2012-09-23: 10 mL

## 2012-09-23 MED ORDER — LIDOCAINE HCL 4 % MT SOLN
OROMUCOSAL | Status: DC | PRN
  Administered 2012-09-23: 4 mL via TOPICAL

## 2012-09-23 MED ORDER — THROMBIN 20000 UNITS EX SOLR
OROMUCOSAL | Status: DC | PRN
  Administered 2012-09-23: 08:00:00 via TOPICAL

## 2012-09-23 MED ORDER — NALOXONE HCL 0.4 MG/ML IJ SOLN: 0.4000 mg | INTRAMUSCULAR | Status: DC | PRN

## 2012-09-23 MED ORDER — PHENYLEPHRINE HCL 10 MG/ML IJ SOLN
10.0000 mg | INTRAMUSCULAR | Status: DC | PRN
  Administered 2012-09-23: 25 ug/min via INTRAVENOUS
  Administered 2012-09-23: 100 ug/min via INTRAVENOUS

## 2012-09-23 MED ORDER — METHOCARBAMOL 500 MG PO TABS
500.0000 mg | ORAL_TABLET | Freq: Four times a day (QID) | ORAL | Status: DC | PRN
  Administered 2012-09-23 – 2012-09-25 (×5): 500 mg via ORAL
  Filled 2012-09-23 (×5): qty 1

## 2012-09-23 MED ORDER — MORPHINE SULFATE (PF) 1 MG/ML IV SOLN
INTRAVENOUS | Status: DC
  Administered 2012-09-23: 22.5 mg via INTRAVENOUS
  Administered 2012-09-23 (×2): via INTRAVENOUS
  Administered 2012-09-23: 15.5 mg via INTRAVENOUS
  Administered 2012-09-24: 1.5 mg via INTRAVENOUS
  Administered 2012-09-24: 12.47 mg via INTRAVENOUS
  Filled 2012-09-23 (×2): qty 25

## 2012-09-23 MED ORDER — LACTATED RINGERS IV SOLN: INTRAVENOUS | Status: DC

## 2012-09-23 MED ORDER — PHENOL 1.4 % MT LIQD: 1.0000 | OROMUCOSAL | Status: DC | PRN

## 2012-09-23 MED ORDER — ACETAMINOPHEN 10 MG/ML IV SOLN
1000.0000 mg | Freq: Four times a day (QID) | INTRAVENOUS | Status: AC
  Administered 2012-09-23 – 2012-09-24 (×4): 1000 mg via INTRAVENOUS
  Filled 2012-09-23 (×4): qty 100

## 2012-09-23 MED ORDER — CALCIUM CHLORIDE 10 % IV SOLN
INTRAVENOUS | Status: DC | PRN
  Administered 2012-09-23: 100 mg via INTRAVENOUS

## 2012-09-23 MED ORDER — DEXAMETHASONE SODIUM PHOSPHATE 4 MG/ML IJ SOLN
4.0000 mg | Freq: Four times a day (QID) | INTRAMUSCULAR | Status: DC
  Administered 2012-09-23 – 2012-09-24 (×4): 4 mg via INTRAVENOUS
  Filled 2012-09-23 (×12): qty 1

## 2012-09-23 MED ORDER — CLONAZEPAM 1 MG PO TABS: 1.0000 mg | ORAL_TABLET | Freq: Three times a day (TID) | ORAL | Status: DC | PRN

## 2012-09-23 MED ORDER — SODIUM CHLORIDE 0.9 % IJ SOLN
3.0000 mL | Freq: Two times a day (BID) | INTRAMUSCULAR | Status: DC
  Administered 2012-09-23 – 2012-09-24 (×3): 3 mL via INTRAVENOUS

## 2012-09-23 MED ORDER — ACETAMINOPHEN 10 MG/ML IV SOLN
1000.0000 mg | Freq: Once | INTRAVENOUS | Status: AC
  Administered 2012-09-23: 1000 mg via INTRAVENOUS
  Filled 2012-09-23: qty 100

## 2012-09-23 MED ORDER — ZOLPIDEM TARTRATE 5 MG PO TABS: 5.0000 mg | ORAL_TABLET | Freq: Every evening | ORAL | Status: DC | PRN

## 2012-09-23 MED ORDER — PROPOFOL 10 MG/ML IV BOLUS
INTRAVENOUS | Status: DC | PRN
  Administered 2012-09-23: 50 mg via INTRAVENOUS
  Administered 2012-09-23: 300 mg via INTRAVENOUS
  Administered 2012-09-23: 30 mg via INTRAVENOUS

## 2012-09-23 MED ORDER — ONDANSETRON HCL 4 MG/2ML IJ SOLN: 4.0000 mg | Freq: Four times a day (QID) | INTRAMUSCULAR | Status: DC | PRN

## 2012-09-23 MED ORDER — THROMBIN 20000 UNITS EX SOLR
CUTANEOUS | Status: AC
  Filled 2012-09-23: qty 20000

## 2012-09-23 MED ORDER — MIDAZOLAM HCL 5 MG/5ML IJ SOLN
INTRAMUSCULAR | Status: DC | PRN
  Administered 2012-09-23: 2 mg via INTRAVENOUS

## 2012-09-23 MED ORDER — MENTHOL 3 MG MT LOZG: 1.0000 | LOZENGE | OROMUCOSAL | Status: DC | PRN

## 2012-09-23 MED ORDER — METHOCARBAMOL 100 MG/ML IJ SOLN
500.0000 mg | Freq: Four times a day (QID) | INTRAVENOUS | Status: DC | PRN
  Filled 2012-09-23: qty 5

## 2012-09-23 MED ORDER — LACTATED RINGERS IV SOLN
INTRAVENOUS | Status: DC | PRN
  Administered 2012-09-23 (×3): via INTRAVENOUS

## 2012-09-23 MED ORDER — CEFAZOLIN SODIUM-DEXTROSE 2-3 GM-% IV SOLR
2.0000 g | Freq: Once | INTRAVENOUS | Status: DC
  Filled 2012-09-23: qty 50

## 2012-09-23 MED ORDER — DEXAMETHASONE 4 MG PO TABS
4.0000 mg | ORAL_TABLET | Freq: Four times a day (QID) | ORAL | Status: DC
  Administered 2012-09-24 – 2012-09-25 (×5): 4 mg via ORAL
  Filled 2012-09-23 (×12): qty 1

## 2012-09-23 MED ORDER — PANTOPRAZOLE SODIUM 40 MG PO TBEC
40.0000 mg | DELAYED_RELEASE_TABLET | Freq: Every day | ORAL | Status: DC
  Administered 2012-09-24 – 2012-09-25 (×2): 40 mg via ORAL
  Filled 2012-09-23: qty 1

## 2012-09-23 MED ORDER — GABAPENTIN 600 MG PO TABS
600.0000 mg | ORAL_TABLET | Freq: Two times a day (BID) | ORAL | Status: DC
  Administered 2012-09-23 – 2012-09-25 (×4): 600 mg via ORAL
  Filled 2012-09-23 (×6): qty 1

## 2012-09-23 SURGICAL SUPPLY — 71 items
ADH SKN CLS APL DERMABOND .7 (GAUZE/BANDAGES/DRESSINGS)
BLADE SURG ROTATE 9660 (MISCELLANEOUS) IMPLANT
BUR EGG ELITE 4.0 (BURR) ×1 IMPLANT
CAGE TLIF XL 10 SPINE (Cage) ×1 IMPLANT
CAP SPINAL LOCKING TI (Cap) ×4 IMPLANT
CLOTH BEACON ORANGE TIMEOUT ST (SAFETY) ×2 IMPLANT
CORDS BIPOLAR (ELECTRODE) ×2 IMPLANT
COVER MAYO STAND STRL (DRAPES) ×2 IMPLANT
COVER SURGICAL LIGHT HANDLE (MISCELLANEOUS) ×2 IMPLANT
COVER TABLE BACK 60X90 (DRAPES) ×1 IMPLANT
DERMABOND ADVANCED (GAUZE/BANDAGES/DRESSINGS)
DERMABOND ADVANCED .7 DNX12 (GAUZE/BANDAGES/DRESSINGS) ×1 IMPLANT
DRAPE C-ARM 42X72 X-RAY (DRAPES) ×2 IMPLANT
DRAPE ORTHO SPLIT 77X108 STRL (DRAPES) ×2
DRAPE POUCH INSTRU U-SHP 10X18 (DRAPES) ×2 IMPLANT
DRAPE SURG 17X23 STRL (DRAPES) ×1 IMPLANT
DRAPE SURG ORHT 6 SPLT 77X108 (DRAPES) ×1 IMPLANT
DRAPE U-SHAPE 47X51 STRL (DRAPES) ×2 IMPLANT
DRSG MEPILEX BORDER 4X8 (GAUZE/BANDAGES/DRESSINGS) ×2 IMPLANT
DURAPREP 26ML APPLICATOR (WOUND CARE) ×2 IMPLANT
ELECT BLADE 4.0 EZ CLEAN MEGAD (MISCELLANEOUS) ×2
ELECT BLADE 6.5 EXT (BLADE) ×1 IMPLANT
ELECT REM PT RETURN 9FT ADLT (ELECTROSURGICAL) ×2
ELECTRODE BLDE 4.0 EZ CLN MEGD (MISCELLANEOUS) IMPLANT
ELECTRODE REM PT RTRN 9FT ADLT (ELECTROSURGICAL) ×1 IMPLANT
GLOVE BIOGEL PI IND STRL 6.5 (GLOVE) ×1 IMPLANT
GLOVE BIOGEL PI IND STRL 8.5 (GLOVE) ×1 IMPLANT
GLOVE BIOGEL PI INDICATOR 6.5 (GLOVE)
GLOVE BIOGEL PI INDICATOR 8.5 (GLOVE) ×1
GLOVE ECLIPSE 6.0 STRL STRAW (GLOVE) ×1 IMPLANT
GLOVE ECLIPSE 8.5 STRL (GLOVE) ×3 IMPLANT
GOWN PREVENTION PLUS XXLARGE (GOWN DISPOSABLE) ×2 IMPLANT
GOWN STRL NON-REIN LRG LVL3 (GOWN DISPOSABLE) ×4 IMPLANT
GUIDEWIRE 1.6X480MM (WIRE) ×2 IMPLANT
IV CATH 14GX2 1/4 (CATHETERS) IMPLANT
KIT BASIN OR (CUSTOM PROCEDURE TRAY) ×2 IMPLANT
KIT ORACLE NEUROMONITING (KITS) ×1 IMPLANT
KIT POSITION SURG JACKSON T1 (MISCELLANEOUS) ×1 IMPLANT
KIT ROOM TURNOVER OR (KITS) ×2 IMPLANT
NDL I-PASS III (NEEDLE) IMPLANT
NDL SPNL 18GX3.5 QUINCKE PK (NEEDLE) ×1 IMPLANT
NEEDLE 22X1 1/2 (OR ONLY) (NEEDLE) ×2 IMPLANT
NEEDLE I-PASS III (NEEDLE) ×2 IMPLANT
NEEDLE SPNL 18GX3.5 QUINCKE PK (NEEDLE) IMPLANT
NS IRRIG 1000ML POUR BTL (IV SOLUTION) ×2 IMPLANT
PACK LAMINECTOMY ORTHO (CUSTOM PROCEDURE TRAY) ×2 IMPLANT
PACK UNIVERSAL I (CUSTOM PROCEDURE TRAY) ×2 IMPLANT
PAD ARMBOARD 7.5X6 YLW CONV (MISCELLANEOUS) ×4 IMPLANT
PATTIES SURGICAL .5 X.5 (GAUZE/BANDAGES/DRESSINGS) IMPLANT
PATTIES SURGICAL .5 X1 (DISPOSABLE) ×2 IMPLANT
PUTTY BONE DBX 2.5 MIS (Bone Implant) ×1 IMPLANT
ROD MATRIX MIS 50MM (Rod) ×2 IMPLANT
SCREW MATRIX MIS 6.0X40MM (Screw) ×2 IMPLANT
SCREW MATRIX MIS 6.0X45MM (Screw) ×2 IMPLANT
SPONGE LAP 4X18 X RAY DECT (DISPOSABLE) ×4 IMPLANT
SPONGE SURGIFOAM ABS GEL 100 (HEMOSTASIS) ×2 IMPLANT
STRIP CLOSURE SKIN 1/2X4 (GAUZE/BANDAGES/DRESSINGS) ×2 IMPLANT
SURGIFLO TRUKIT (HEMOSTASIS) ×1 IMPLANT
SUT MNCRL AB 3-0 PS2 18 (SUTURE) ×4 IMPLANT
SUT VIC AB 1 CT1 18XCR BRD 8 (SUTURE) IMPLANT
SUT VIC AB 1 CT1 8-18 (SUTURE) ×2
SUT VIC AB 2-0 CT1 18 (SUTURE) ×3 IMPLANT
SUT VICRYL 0 UR6 27IN ABS (SUTURE) ×2 IMPLANT
SYR BULB IRRIGATION 50ML (SYRINGE) ×2 IMPLANT
SYR CONTROL 10ML LL (SYRINGE) ×2 IMPLANT
TAP CANN 5MM (TAP) ×1 IMPLANT
TOWEL OR 17X24 6PK STRL BLUE (TOWEL DISPOSABLE) ×2 IMPLANT
TOWEL OR 17X26 10 PK STRL BLUE (TOWEL DISPOSABLE) ×2 IMPLANT
TRAY FOLEY CATH 16FRSI W/METER (SET/KITS/TRAYS/PACK) ×2 IMPLANT
WATER STERILE IRR 1000ML POUR (IV SOLUTION) ×2 IMPLANT
YANKAUER SUCT BULB TIP NO VENT (SUCTIONS) ×2 IMPLANT

## 2012-09-23 NOTE — Transfer of Care (Signed)
Immediate Anesthesia Transfer of Care Note  Patient: Mark Good  Procedure(s) Performed: Procedure(s): TRANSFORAMINAL LUMBAR INTERBODY FUSION (TLIF) WITH PEDICLE SCREW FIXATION 1 LEVEL L5-S1 (N/A)  Patient Location: PACU  Anesthesia Type:General  Level of Consciousness: awake, alert , oriented and patient cooperative  Airway & Oxygen Therapy: Patient Spontanous Breathing and Patient connected to face mask oxygen  Post-op Assessment: Report given to PACU RN and Post -op Vital signs reviewed and stable  Post vital signs: Reviewed and stable  Complications: No apparent anesthesia complications

## 2012-09-23 NOTE — Progress Notes (Signed)
UR COMPLETED  

## 2012-09-23 NOTE — Progress Notes (Signed)
Pt admitted to room 5n07 he is alert and oriented VSS, dsg lower back CDI ,  Moves extremities well

## 2012-09-23 NOTE — H&P (Signed)
No change in clinical exam H+P reviewed  

## 2012-09-23 NOTE — Anesthesia Preprocedure Evaluation (Addendum)
Anesthesia Evaluation  Patient identified by MRN, date of birth, ID band Patient awake    Reviewed: Allergy & Precautions, H&P , NPO status , Patient's Chart, lab work & pertinent test results, reviewed documented beta blocker date and time   History of Anesthesia Complications Negative for: history of anesthetic complications  Airway Mallampati: II TM Distance: >3 FB Neck ROM: Full    Dental  (+) Teeth Intact and Dental Advisory Given   Pulmonary sleep apnea and Continuous Positive Airway Pressure Ventilation ,  breath sounds clear to auscultation        Cardiovascular hypertension, Pt. on medications and Pt. on home beta blockers Rhythm:Regular Rate:Normal     Neuro/Psych  Headaches, Anxiety Depression    GI/Hepatic Neg liver ROS, GERD-  Medicated and Controlled,  Endo/Other  Hypothyroidism   Renal/GU negative Renal ROS     Musculoskeletal   Abdominal   Peds  Hematology negative hematology ROS (+)   Anesthesia Other Findings   Reproductive/Obstetrics negative OB ROS                          Anesthesia Physical Anesthesia Plan  ASA: III  Anesthesia Plan: General   Post-op Pain Management:    Induction: Intravenous  Airway Management Planned: Oral ETT  Additional Equipment:   Intra-op Plan:   Post-operative Plan: Extubation in OR  Informed Consent: I have reviewed the patients History and Physical, chart, labs and discussed the procedure including the risks, benefits and alternatives for the proposed anesthesia with the patient or authorized representative who has indicated his/her understanding and acceptance.     Plan Discussed with: CRNA, Anesthesiologist and Surgeon  Anesthesia Plan Comments:         Anesthesia Quick Evaluation

## 2012-09-23 NOTE — Anesthesia Postprocedure Evaluation (Signed)
Anesthesia Post Note  Patient: Mark Good  Procedure(s) Performed: Procedure(s) (LRB): TRANSFORAMINAL LUMBAR INTERBODY FUSION (TLIF) WITH PEDICLE SCREW FIXATION 1 LEVEL L5-S1 (N/A)  Anesthesia type: general  Patient location: PACU  Post pain: Pain level controlled  Post assessment: Patient's Cardiovascular Status Stable  Last Vitals:  Filed Vitals:   09/23/12 1335  BP: 122/77  Pulse: 66  Temp: 36.8 C  Resp: 16    Post vital signs: Reviewed and stable  Level of consciousness: sedated  Complications: No apparent anesthesia complications

## 2012-09-23 NOTE — Brief Op Note (Signed)
09/23/2012  11:33 AM  PATIENT:  Mark Good  47 y.o. male  PRE-OPERATIVE DIAGNOSIS:  DEGENERATIVE DISC DISEASE WITH LOW GRADE sLIP LUMBAR 5-SACRAL 1  POST-OPERATIVE DIAGNOSIS:  degenerative disc disease with low grade slip lumbar 5-sacral 1  PROCEDURE:  Procedure(s): TRANSFORAMINAL LUMBAR INTERBODY FUSION (TLIF) WITH PEDICLE SCREW FIXATION 1 LEVEL L5-S1 (N/A)  SURGEON:  Surgeon(s) and Role:    * Venita Lick, MD - Primary  PHYSICIAN ASSISTANT:   ASSISTANTS: none   ANESTHESIA:   general  EBL:  Total I/O In: 2500 [I.V.:2500] Out: 600 [Urine:300; Blood:300]  BLOOD ADMINISTERED:none  DRAINS: none   LOCAL MEDICATIONS USED:  MARCAINE     SPECIMEN:  No Specimen  DISPOSITION OF SPECIMEN:  N/A  COUNTS:  YES  TOURNIQUET:  * No tourniquets in log *  DICTATION: .Other Dictation: Dictation Number [id number]  PLAN OF CARE: Admit to inpatient   PATIENT DISPOSITION:  PACU - hemodynamically stable.

## 2012-09-23 NOTE — Anesthesia Procedure Notes (Signed)
Procedure Name: Intubation Date/Time: 09/23/2012 8:19 AM Performed by: Lovie Chol Pre-anesthesia Checklist: Patient identified, Emergency Drugs available, Suction available, Patient being monitored and Timeout performed Patient Re-evaluated:Patient Re-evaluated prior to inductionOxygen Delivery Method: Circle system utilized Preoxygenation: Pre-oxygenation with 100% oxygen Intubation Type: IV induction Ventilation: Mask ventilation without difficulty, Oral airway inserted - appropriate to patient size and Two handed mask ventilation required Laryngoscope Size: Miller and 3 Grade View: Grade II Tube type: Oral Tube size: 8.0 mm Number of attempts: 1 Airway Equipment and Method: Stylet and LTA kit utilized Placement Confirmation: ETT inserted through vocal cords under direct vision,  positive ETCO2,  CO2 detector and breath sounds checked- equal and bilateral Secured at: 23 cm Tube secured with: Tape Dental Injury: Teeth and Oropharynx as per pre-operative assessment

## 2012-09-23 NOTE — Op Note (Signed)
NAME:  Mark Good, Mark Good NO.:  [id number]  MEDICAL RECORD NO.:  [id number]  LOCATION:  5N07C                        FACILITY:  [facility]  PHYSICIAN:  Alvy Beal, MD    DATE OF BIRTH:  03-05-1966  DATE OF PROCEDURE:  09/23/2012 DATE OF DISCHARGE:                              OPERATIVE REPORT   PREOPERATIVE DIAGNOSIS:  L5-S1 spondylosis with radicular leg pain.  POSTOPERATIVE DIAGNOSE:  L5-S1 spondylosis with radicular leg pain.  OPERATIVE PROCEDURE: 1. Gill decompression L5-S1. 2. Complete diskectomy with implantation of intervertebral     biomechanical device, L5-S1 utilizing the Titan large 10 x 35, cage     packed with local bone autograft plus DBX mix. 3. Posterolateral arthrodesis, L5-S1 with local bone and DBX mix. 4. Posterolateral segmental instrumentation and fusion L5-S1 with     Matrix Synthes, Matrix pedicle screws.  HISTORY:  This is a very pleasant gentleman who has been having long- standing debilitating back, buttock, and left leg pain.  Attempts at conservative management has failed to alleviate his symptoms.  As a result of the ongoing nature of his complaints, he elected to proceed with surgery.  All appropriate risks, benefits, and alternatives were discussed with the patient and consent was obtained.  OPERATIVE NOTE:  The patient was brought to the operating room, placed supine on the operating table.  After successful induction of general anesthesia and endotracheal intubation, TEDs, SCDs, and Foley were inserted.  The patient was turned prone onto the Wilson frame and all bony prominences were well padded.  Back was prepped and draped in a standard fashion.  Time-out was done to confirm patient, procedure, and all other pertinent important data.  It should be also noted that the neuro stim monitors were placed prior to the positioning and turning by the NeuroStim representative.  At this point, once the time-out was completed,  we proceeded to surgery.  I identified the lateral borders of the L5 and S1 pedicles bilaterally with AP fluoro and then the small incision on the right side where the lateral border of the pedicle.  I advanced the Jamshidi needle through percutaneously down to the lateral aspect of the facet complex.  I advanced this using fluoroscopy and direct neural stimulation.  I advanced to the medial wall of the pedicle on the AP view, and then in the lateral view, confirmed the trajectory and I was beyond the posterior vertebral wall.  At this point, since there was no abnormal EMG activity, and I passed the posterior vertebral wall I knew I was in the pedicle.  I advanced the Jamshidi further and then placed a guide pin through this to cannulate the pedicle.  I repeated this entire procedure at the S1 level on that right side, and on the L5 and S1 levels on the left side.  Once I had all 4 pedicles cannulated on the right side, I made the incision somewhat there, tapped this and then placed a 45 mm 6.0 diameter pedicle screws.  I then directly steamed the screw and confirmed that I was both radiographically and neurodiagnostically in the pedicle without breach.  I repeated this at the S1 level.  Once  I had the right L5 and S1 pedicles placed, I then went to the left side and using the same technique, I placed the pedicle screws.  At this point, all 4 pedicle screws were properly positioned. On the left side, I made a more extensive Wiltse approach.  I connected the incisions made to place the pedicle screws and then widening somewhat.  I then dissected down to the deep fascia and then bluntly and incised the deep fascia, and then bluntly dissected through the paraspinal muscles until I could see the epithelial anterior aspect of the spine.  I then placed my self-retaining retractor, and expanded the blades until I could see the posterior elements of L5 and S1.  I then confirmed that I was at the  appropriate level with lateral fluoroscopy and then performed a generous laminotomy of L5 using a 2 or 3 mm Kerrison.  At this point, I then elevated off using a fine curette and pituitary rongeur.  I removed the ligamentum flavum and exposed the extensive epidural fat.  I dissected through this and removed it until I could and now I could visualize the lateral aspect of the thecal sac.  I then continued my dissection into the lateral recess.  I then used osteotome to remove the inferior L5 facet complex in its entirety.  Once this was removed, I had good visualization in the superior S1 facet.  I then removed the ligamentum flavum and overhanging osteophyte from the superior S1 facet.  I could then visualize the S1 nerve root and the medial border of the S1 pedicle.  Palpation of the inferior aspect of the pedicle and medial aspect of pedicle demonstrated no breach by the pedicle screw.  I then removed all of the superior excess of the S1 facet complex, S1 facet.  I then dissected superiorly removing the remainder of the L5 pars.  I identified the L5 nerve root, protected it. At this point, I had the L5 and S1 nerve roots completely decompressed, and I could palpate the medial and inferior border of the L5 pedicle confirming there was no breach.  I then mobilized the thecal sac medially and then identified the disk space.  I incised the annulus with a 15 blade scalpel and then using a combination of pituitary rongeurs, various curettes, and Kerrison rongeurs, I removed all the disk material.  I used a side-cutting curettes to advance across the disk space to the more contralateral side and removed the disk material from this side.  I had solid bony end plates that were bleeding.  At this point, I placed bone graft that I had harvested from the decompression along the anterior annulus and then packed it into place.  I elected to use a size 10, 35 (large) Titanium cage.  This was packed  with the local bone plus DBX mix.  I malleted this to the appropriate depth.  Good positioning in both the AP and lateral planes.  Once I had this properly positioned, I then made sure I was countersunk beneath the nerve.  Once I had confirmed this, I then irrigated the wound copiously with normal saline.  I obtained hemostasis using bipolar electrocautery, removed all the neural patties and placed a thrombin-soaked Gelfoam patty over the exposed thecal sac.  I then measured the screw distance to place the rod, I passed a 50 mm rod and secured it according the infection withstand with the end cap.  I then torqued the end caps down and had solid  fixation.  I then irrigated one last time, and then closed the deep fascia with interrupted #1 Vicryl suture.  I then closed the deep layer with Vicryl suture, and then 2-0 Vicryl suture, 3-0 Monocryl for the skin.  On the contralateral side, I measured again and using the same technique, placed the same size rod and torqued it into position, and then closed after irrigating and all in a similar fashion.  Steri- Strips were also applied as was a dry dressing.  The patient was ultimately extubated and transferred to the PACU without incident.  At the end of the case, all needle and sponge counts were correct.  There was no adverse intraoperative events.     Alvy Beal, MD     DDB/MEDQ  D:  09/23/2012  T:  09/23/2012  Job:  [id number]

## 2012-09-24 MED ORDER — DOCUSATE SODIUM 100 MG PO CAPS: 100.0000 mg | ORAL_CAPSULE | Freq: Three times a day (TID) | ORAL | Status: DC | PRN

## 2012-09-24 MED ORDER — POLYETHYLENE GLYCOL 3350 17 GM/SCOOP PO POWD: 17.0000 g | Freq: Every day | ORAL | Status: DC

## 2012-09-24 MED ORDER — ONDANSETRON HCL 4 MG PO TABS: 4.0000 mg | ORAL_TABLET | Freq: Three times a day (TID) | ORAL | Status: DC | PRN

## 2012-09-24 MED ORDER — OXYCODONE-ACETAMINOPHEN 10-325 MG PO TABS: 1.0000 | ORAL_TABLET | ORAL | Status: DC | PRN

## 2012-09-24 NOTE — Evaluation (Signed)
Occupational Therapy Evaluation Patient Details Name: Mark Good MRN: [id number] DOB: 09/03/1965 Today's Date: 09/24/2012 Time: 1478-2956 OT Time Calculation (min): 26 min  OT Assessment / Plan / Recommendation History of present illness s/p TRANSFORAMINAL LUMBAR INTERBODY FUSION (TLIF) WITH PEDICLE SCREW FIXATION 1 LEVEL L5-S1 (N/A) 1 Day Post-Op     Clinical Impression   Pt doing well with minimal reports of pain. Pt sup - Mod I with ADL mobility and mod A with LB ADLs. Pt provided with ADL A/E and DME ed for home safety/ All education completed and no further acute OT services needed at this time    OT Assessment  All further OT needs can be met in the next venue of care    Follow Up Recommendations  Home health OT;Supervision - Intermittent    Barriers to Discharge  None    Equipment Recommendations  3 in 1 bedside comode    Recommendations for Other Services    Frequency       Precautions / Restrictions Precautions Precautions: Back Precaution Booklet Issued: Yes (comment) (handout) Precaution Comments: pt able to recall 3/3 back precautions Required Braces or Orthoses: Spinal Brace Spinal Brace: Lumbar corset;Applied in sitting position Restrictions Weight Bearing Restrictions: No   Pertinent Vitals/Pain 6/10    ADL  Grooming: Performed;Wash/dry hands;Wash/dry face;Supervision/safety;Set up Where Assessed - Grooming: Supported standing Upper Body Bathing: Simulated;Supervision/safety;Set up Lower Body Bathing: Simulated;Moderate assistance Upper Body Dressing: Performed;Supervision/safety;Set up Lower Body Dressing: Performed;Moderate assistance Toilet Transfer: Research scientist (life sciences) Method: Sit to Barista: Regular height toilet;Raised toilet seat with arms (or 3-in-1 over toilet);Grab bars Tub/Shower Transfer: Performed;Supervision/safety;Min guard Tub/Shower Transfer Method: Land: Grab bars;Shower seat without back;Walk in shower Equipment Used: Long-handled shoe horn;Long-handled sponge;Reacher;Gait belt;Rolling walker ADL Comments: Pt provided with education and demo of ADL A/E with handout/pictures    OT Diagnosis: Acute pain;Generalized weakness  OT Problem List: Decreased knowledge of use of DME or AE;Pain;Decreased safety awareness;Decreased knowledge of precautions OT Treatment Interventions:     OT Goals(Current goals can be found in the care plan section) Acute Rehab OT Goals Patient Stated Goal: " To return home "  Visit Information  Last OT Received On: 09/24/12 Assistance Needed: +1 History of Present Illness: s/p TRANSFORAMINAL LUMBAR INTERBODY FUSION (TLIF) WITH PEDICLE SCREW FIXATION 1 LEVEL L5-S1 (N/A) 1 Day Post-Op         Prior Functioning     Home Living Family/patient expects to be discharged to:: Private residence Living Arrangements: Spouse/significant other Available Help at Discharge: Family Type of Home: House Home Access: Stairs to enter Secretary/administrator of Steps: 2 Entrance Stairs-Rails: None Home Layout: Multi-level Alternate Level Stairs-Number of Steps: 5 Alternate Level Stairs-Rails: Can reach both Home Equipment: Shower seat;Toilet riser;Shower seat - built in Prior Function Level of Independence: Independent Communication Communication: No difficulties Dominant Hand: Right         Vision/Perception Vision - History Baseline Vision: Wears glasses only for reading Patient Visual Report: No change from baseline Perception Perception: Within Functional Limits   Cognition  Cognition Arousal/Alertness: Awake/alert Behavior During Therapy: WFL for tasks assessed/performed Overall Cognitive Status: Within Functional Limits for tasks assessed    Extremity/Trunk Assessment Upper Extremity Assessment Upper Extremity Assessment: Overall WFL for tasks assessed Lower Extremity Assessment Lower  Extremity Assessment: Generalized weakness (unable to sit in tailor position for LE dressing; hip kit?)     Mobility Bed Mobility Bed Mobility: Rolling Right;Right Sidelying to Sit Rolling Right: 5: Supervision  Right Sidelying to Sit: 5: Supervision Details for Bed Mobility Assistance: min verbal cues for technique to minimize back rotation; return demo safety Transfers Transfers: Sit to Stand;Stand to Sit Sit to Stand: 6: Modified independent (Device/Increase time) Stand to Sit: 6: Modified independent (Device/Increase time)     Exercise     Balance Balance Balance Assessed: No   End of Session OT - End of Session Equipment Utilized During Treatment: Gait belt;Back brace Activity Tolerance: Patient tolerated treatment well Patient left: in bed  GO     Galen Manila 09/24/2012, 11:56 AM

## 2012-09-24 NOTE — Plan of Care (Signed)
Problem: Phase II Progression Outcomes Goal: Discharge plan established Recommend HH OT for home safety ADL and ADL mobility trg after acute care d/c

## 2012-09-24 NOTE — Progress Notes (Signed)
   CARE MANAGEMENT NOTE 09/24/2012  Patient:  Mark Good, Mark Good   Account Number:  [id number]  Date Initiated:  09/24/2012  Documentation initiated by:  [facility]  Subjective/Objective Assessment:   TRANSFORAMINAL LUMBAR INTERBODY FUSION (TLIF) WITH PEDICLE SCREW FIXATION     Action/Plan:   waiting PT/OT eval   Anticipated DC Date:  09/26/2012   Anticipated DC Plan:  HOME W HOME HEALTH SERVICES      DC Planning Services  CM consult      Choice offered to / List presented to:             Status of service:  In process, will continue to follow Medicare Important Message given?   (If response is "NO", the following Medicare IM given date fields will be blank) Date Medicare IM given:   Date Additional Medicare IM given:    Discharge Disposition:    Per UR Regulation:    If discussed at Long Length of Stay Meetings, dates discussed:    Comments:  09/24/2012 1200 NCM provided pt with [facility] agency list. Waiting recommendation for [facility] and DME. Isidoro Donning RN CCM Case Mgmt phone [phone]

## 2012-09-24 NOTE — Progress Notes (Signed)
    Subjective: Procedure(s) (LRB): TRANSFORAMINAL LUMBAR INTERBODY FUSION (TLIF) WITH PEDICLE SCREW FIXATION 1 LEVEL L5-S1 (N/A) 1 Day Post-Op  Patient reports pain as 4 on 0-10 scale.  Reports decreased leg pain reports incisional back pain   Positive void Negative bowel movement Positive flatus Negative chest pain or shortness of breath  Objective: Vital signs in last 24 hours: Temp:  [97.6 F (36.4 C)-98.5 F (36.9 C)] 97.8 F (36.6 C) (07/18 0627) Pulse Rate:  [63-78] 66 (07/18 0627) Resp:  [10-20] 18 (07/18 0627) BP: (115-144)/(66-87) 144/67 mmHg (07/18 0627) SpO2:  [2 %-100 %] 99 % (07/18 0627)  Intake/Output from previous day: 07/17 0701 - 07/18 0700 In: 2900 [I.V.:2900] Out: 2700 [Urine:2400; Blood:300]  Labs:  Recent Labs  09/22/12 0844  WBC 5.1  RBC 4.32  HCT 36.2*  PLT 217    Recent Labs  09/22/12 0844  NA 133*  K 4.0  CL 96  CO2 25  BUN 7  CREATININE 0.90  GLUCOSE 99  CALCIUM 9.5   No results found for this basename: LABPT, INR,  in the last 72 hours  Physical Exam: Neurologically intact ABD soft Intact pulses distally Incision: dressing C/D/I and no drainage Compartment soft  Assessment/Plan: Patient stable  xrays pending  Continue mobilization with physical therapy Continue care  Advance diet Up with therapy Plan for discharge tomorrow if stable  Venita Lick, MD [facility] [phone]

## 2012-09-24 NOTE — Progress Notes (Signed)
SW received a consult for possible placement. PT  At this time is recommending home with HH and not SNF. CSW will make CM aware. Clinical Social Worker will sign off for now as social work intervention is no longer needed. Please consult us again if new need arises.   Alexxia Stankiewicz, MSW [phone] 

## 2012-09-24 NOTE — Progress Notes (Signed)
Pt placed on CPAP for a nap CPAP 12cmH20,  Pt wearing full face mask from home & tolerating well. RT will continue to monitor pt progress.

## 2012-09-24 NOTE — Discharge Summary (Signed)
Patient ID: Mark Good MRN: [id number] DOB/AGE: 46-May-1967 46 y.o.  Admit date: 09/23/2012 Discharge date: 09/24/2012  Admission Diagnoses:  Active Problems:   * No active hospital problems. *   Discharge Diagnoses:  Active Problems:   * No active hospital problems. *  status post Procedure(s): TRANSFORAMINAL LUMBAR INTERBODY FUSION (TLIF) WITH PEDICLE SCREW FIXATION 1 LEVEL L5-S1  Past Medical History  Diagnosis Date  . Hypertension   . Depression   . Anxiety   . Hypothyroidism   . Headache(784.0)   . Sleep apnea     uses cpap  . GERD (gastroesophageal reflux disease)     Surgeries: Procedure(s): TRANSFORAMINAL LUMBAR INTERBODY FUSION (TLIF) WITH PEDICLE SCREW FIXATION 1 LEVEL L5-S1 on 09/23/2012   Consultants:  none  Discharged Condition: Improved  Hospital Course: Mark Good is an 47 y.o. male who was admitted 09/23/2012 for operative treatment of <principal problem not specified>. Patient failed conservative treatments (please see the history and physical for the specifics) and had severe unremitting pain that affects sleep, daily activities and work/hobbies. After pre-op clearance, the patient was taken to the operating room on 09/23/2012 and underwent  Procedure(s): TRANSFORAMINAL LUMBAR INTERBODY FUSION (TLIF) WITH PEDICLE SCREW FIXATION 1 LEVEL L5-S1.    Patient was given perioperative antibiotics: Anti-infectives   Start     Dose/Rate Route Frequency Ordered Stop   09/23/12 1415  ceFAZolin (ANCEF) IVPB 1 g/50 mL premix     1 g 100 mL/hr over 30 Minutes Intravenous Every 8 hours 09/23/12 1344 09/23/12 2150   09/23/12 1030  ceFAZolin (ANCEF) IVPB 2 g/50 mL premix     2 g 100 mL/hr over 30 Minutes Intravenous  Once 09/23/12 1022 09/23/12 1122   09/23/12 1030  ceFAZolin (ANCEF) IVPB 2 g/50 mL premix  Status:  Discontinued     2 g 100 mL/hr over 30 Minutes Intravenous  Once 09/23/12 1025 09/23/12 1330   09/22/12 1423  ceFAZolin (ANCEF) 3 g in dextrose  5 % 50 mL IVPB     3 g 160 mL/hr over 30 Minutes Intravenous 60 min pre-op 09/22/12 1423 09/23/12 0730       Patient was given sequential compression devices and early ambulation to prevent DVT.   Patient benefited maximally from hospital stay and there were no complications. At the time of discharge, the patient was urinating/moving their bowels without difficulty, tolerating a regular diet, pain is controlled with oral pain medications and they have been cleared by PT/OT.   Recent vital signs: Patient Vitals for the past 24 hrs:  BP Temp Pulse Resp SpO2  09/24/12 0627 144/67 mmHg 97.8 F (36.6 C) 66 18 99 %  09/24/12 0400 - - - 20 96 %  09/24/12 0000 - - - 20 96 %  09/23/12 2301 - - 77 20 98 %  09/23/12 2033 127/75 mmHg 98 F (36.7 C) 75 20 96 %  09/23/12 2000 - - - 16 97 %  09/23/12 1700 - - 72 18 97 %  09/23/12 1614 - - - 18 38 %  09/23/12 1335 122/77 mmHg 98.3 F (36.8 C) 66 16 98 %  09/23/12 1315 124/73 mmHg 97.6 F (36.4 C) 63 10 100 %  09/23/12 1300 115/66 mmHg - 68 14 100 %  09/23/12 1245 133/84 mmHg - 73 12 99 %  09/23/12 1240 - - - 14 2 %  09/23/12 1230 140/87 mmHg - 74 16 98 %  09/23/12 1215 132/84 mmHg - 78 18  100 %  09/23/12 1200 135/73 mmHg - 71 12 100 %  09/23/12 1159 - 98.5 F (36.9 C) - - -     Recent laboratory studies:  Recent Labs  09/22/12 0844  WBC 5.1  HGB 12.2*  HCT 36.2*  PLT 217  NA 133*  K 4.0  CL 96  CO2 25  BUN 7  CREATININE 0.90  GLUCOSE 99  CALCIUM 9.5     Discharge Medications:     Medication List    STOP taking these medications       BC HEADACHE PO     GOODY HEADACHE PO      TAKE these medications       ALPRAZolam 1 MG tablet  Commonly known as:  XANAX  Take 1 mg by mouth 3 (three) times daily as needed. For stress/anxiety     atenolol 100 MG tablet  Commonly known as:  TENORMIN  Take 100 mg by mouth daily.     clonazePAM 1 MG tablet  Commonly known as:  KLONOPIN  Take 1 mg by mouth 3 (three) times daily  as needed for anxiety.     docusate sodium 100 MG capsule  Commonly known as:  COLACE  Take 1 capsule (100 mg total) by mouth 3 (three) times daily as needed for constipation.     DULoxetine 60 MG capsule  Commonly known as:  CYMBALTA  Take 60 mg by mouth daily.     FISH OIL PO  Take 2 capsules by mouth every evening.     gabapentin 600 MG tablet  Commonly known as:  NEURONTIN  Take 600 mg by mouth 2 (two) times daily.     levothyroxine 112 MCG tablet  Commonly known as:  SYNTHROID, LEVOTHROID  Take 112 mcg by mouth daily. Patient must have synthyroid brand. Generics are not effective.     niacin 1000 MG CR tablet  Commonly known as:  NIASPAN  Take 1,500 mg by mouth at bedtime.     ondansetron 4 MG tablet  Commonly known as:  ZOFRAN  Take 1 tablet (4 mg total) by mouth every 8 (eight) hours as needed for nausea.     oxyCODONE-acetaminophen 10-325 MG per tablet  Commonly known as:  PERCOCET  Take 1 tablet by mouth every 4 (four) hours as needed for pain.     pantoprazole 40 MG tablet  Commonly known as:  PROTONIX  Take 40 mg by mouth daily.     polyethylene glycol powder powder  Commonly known as:  GLYCOLAX  Take 17 g by mouth daily.     simvastatin 40 MG tablet  Commonly known as:  ZOCOR  Take 40 mg by mouth at bedtime.     VITAMIN B COMPLEX PO  Take 1 tablet by mouth daily.     VITAMIN D PO  Take 2 capsules by mouth daily.        Diagnostic Studies: Dg Lumbar Spine 2-3 Views  09/23/2012   *RADIOLOGY REPORT*  Clinical Data: L5 - S1 TLIF  DG C-ARM 61-120 MIN,LUMBAR SPINE - 2-3 VIEW  Comparison:  Lumbar spine radiographs - 09/23/2012.  Fluoroscopy time:  1 minute, 34 seconds  Findings:  2 spot fluoroscopic intraoperative images of the lower lumbar spine provided for review.  Image labeling is in keeping with preprocedural lumbar spine radiographs.  Post L5 - S1 paraspinal fusion and associated intervertebral disc space replacement.  There has been apparent  restoration of the L5 - S1 intervertebral disc  space.  No radiopaque foreign body.  IMPRESSION: Post L5 - S1 paraspinal fusion and intervertebral disc space replacement without definite evidence of complication.   Original Report Authenticated By: Tacey Ruiz, MD   Dg Lumbar Spine 2-3 Views  09/23/2012   **ADDENDUM** CREATED: 09/23/2012 07:14:53  **END ADDENDUM** SIGNED BY: Dineen Kid. Chestine Spore, M.D.  09/23/2012   **ADDENDUM** CREATED: 09/23/2012 07:14:53  **END ADDENDUM** SIGNED BY: Dineen Kid. Chestine Spore, M.D.  09/23/2012   *RADIOLOGY REPORT*  Clinical Data: Preop lumbar fusion  LUMBAR SPINE - 2-3 VIEW  Comparison: Lumbar MRI 06/19/2012  Findings: Five lumbar vertebral segments were numbered for preoperative planning purposes  Normal lumbar alignment.  Negative for fracture.  Disc degeneration and mild spurring L5-S1.  Remaining disc spaces are intact.  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   Original Report Authenticated By: Janeece Riggers, M.D.   Dg C-arm [phone] Min  09/23/2012   *RADIOLOGY REPORT*  Clinical Data: L5 - S1 TLIF  DG C-ARM 61-120 MIN,LUMBAR SPINE - 2-3 VIEW  Comparison:  Lumbar spine radiographs - 09/23/2012.  Fluoroscopy time:  1 minute, 34 seconds  Findings:  2 spot fluoroscopic intraoperative images of the lower lumbar spine provided for review.  Image labeling is in keeping with preprocedural lumbar spine radiographs.  Post L5 - S1 paraspinal fusion and associated intervertebral disc space replacement.  There has been apparent restoration of the L5 - S1 intervertebral disc space.  No radiopaque foreign body.  IMPRESSION: Post L5 - S1 paraspinal fusion and intervertebral disc space replacement without definite evidence of complication.   Original Report Authenticated By: Tacey Ruiz, MD        Discharge Plan:  discharge to stable  Disposition: home    Signed: Venita Lick D for Dr. Venita Lick [facility] [phone] 09/24/2012, 8:01 AM

## 2012-09-24 NOTE — Evaluation (Signed)
Physical Therapy Evaluation Patient Details Name: Mark Good MRN: [id number] DOB: 25-Jan-1966 Today's Date: 09/24/2012 Time: 4098-1191 PT Time Calculation (min): 38 min  PT Assessment / Plan / Recommendation History of Present Illness    s/p TRANSFORAMINAL LUMBAR INTERBODY FUSION (TLIF) WITH PEDICLE SCREW FIXATION 1 LEVEL L5-S1 (N/A) 1 Day Post-Op    Clinical Impression  Pt presents with minimal pain and min to moderate restrictions in functional mobility but generally at modified independent level.  Spouse concerned about pt's carry-over and adherence to all safety education and requests HHPT for home environmental assessment and education.  All acute education completed and pt/spouse able to mobilize safely.  No acute PT needed.  Anticipate d/c home today if medically ready.    PT Assessment  All further PT needs can be met in the next venue of care    Follow Up Recommendations  Home health PT;Supervision - Intermittent    Does the patient have the potential to tolerate intense rehabilitation      Barriers to Discharge        Equipment Recommendations  Rolling walker with 5" wheels;3in1 (PT)    Recommendations for Other Services     Frequency      Precautions / Restrictions Precautions Precautions: Back Precaution Booklet Issued: Yes (comment) (handout) Precaution Comments: educated on no BEND-ARCH-TWIST or lift >10 lbs; sleep on side or back with pillows to support, use pilllow for back support in chair, walk multiple short bouts daily; wear brace when up.  Pt and spouse return demon and verbalize Required Braces or Orthoses: Spinal Brace Spinal Brace: Lumbar corset;Applied in sitting position   Pertinent Vitals/Pain Minimal pain in back rated 6/10 premobilty; improves somewhat with walking and wearing brace       Mobility  Bed Mobility Bed Mobility: Rolling Right;Right Sidelying to Sit Rolling Right: 5: Supervision Right Sidelying to Sit: 5: Supervision Details  for Bed Mobility Assistance: min verbal cues for technique to minimize back rotation; return demo safety Transfers Transfers: Sit to Stand;Stand to Sit Sit to Stand: 6: Modified independent (Device/Increase time) Stand to Sit: 6: Modified independent (Device/Increase time) Ambulation/Gait Ambulation/Gait Assistance: 6: Modified independent (Device/Increase time) Ambulation Distance (Feet): 200 Feet Assistive device: Rolling walker Ambulation/Gait Assistance Details: verbal instruction re speed and stride; educated to self-assess back symptoms with walking and to manage with adjustments in stride and speed;  Gait Pattern: Step-through pattern;Decreased stride length;Antalgic Gait velocity: decreased General Gait Details: restricted stride length due to 'pull' in back Stairs: Yes Stairs Assistance: 5: Supervision Stairs Assistance Details (indicate cue type and reason): educated on strong leg up/weak (left) leg down; progessed quickly for 2 rails to 1 rail to no rails over repeated trails with no s/s fatigue or leg weakness; spouse participated in session with instruction on safest technique to assist at home Stair Management Technique: No rails;One rail Right;Two rails;Alternating pattern;Forwards Number of Stairs: 20 (5 steps (over and back) x4 trials)    Exercises     PT Diagnosis: Difficulty walking;Acute pain  PT Problem List: Decreased strength;Decreased range of motion;Decreased mobility;Decreased knowledge of use of DME;Obesity;Pain PT Treatment Interventions:       PT Goals(Current goals can be found in the care plan section) Acute Rehab PT Goals Patient Stated Goal: go home PT Goal Formulation: No goals set, d/c therapy  Visit Information  Last PT Received On: 09/24/12 Assistance Needed: +1       Prior Functioning  Home Living Family/patient expects to be discharged to:: Private residence Living  Arrangements: Spouse/significant other Available Help at Discharge:  Family Type of Home: House Home Access: Stairs to enter Secretary/administrator of Steps: 2 Entrance Stairs-Rails: None Home Layout: Multi-level Alternate Level Stairs-Number of Steps: 5 Alternate Level Stairs-Rails: Can reach both Prior Function Level of Independence: Independent Communication Communication: No difficulties Dominant Hand: Right    Cognition  Cognition Arousal/Alertness: Awake/alert Behavior During Therapy: WFL for tasks assessed/performed Overall Cognitive Status: Within Functional Limits for tasks assessed    Extremity/Trunk Assessment Upper Extremity Assessment Upper Extremity Assessment: Overall WFL for tasks assessed Lower Extremity Assessment Lower Extremity Assessment: Generalized weakness (unable to sit in tailor position for LE dressing; hip kit?)   Balance    End of Session PT - End of Session Equipment Utilized During Treatment: Back brace Activity Tolerance: Patient tolerated treatment well Patient left: in chair;with family/visitor present;with call bell/phone within reach Nurse Communication: Mobility status (home d/c needs)  GP     Dennis Bast 09/24/2012, 9:33 AM

## 2012-09-24 NOTE — Progress Notes (Signed)
   CARE MANAGEMENT NOTE 09/24/2012  Patient:  Mark Good, Mark Good   Account Number:  [id number]  Date Initiated:  09/24/2012  Documentation initiated by:  [facility]  Subjective/Objective Assessment:   TRANSFORAMINAL LUMBAR INTERBODY FUSION (TLIF) WITH PEDICLE SCREW FIXATION     Action/Plan:   waiting PT/OT eval   Anticipated DC Date:  09/26/2012   Anticipated DC Plan:  HOME W HOME HEALTH SERVICES      DC Planning Services  CM consult      [facility] Choice  HOME HEALTH   Choice offered to / List presented to:  C-1 Patient        HH arranged  HH-2 PT  HH-3 OT      [facility] agency  [facility]   Status of service:  Completed, signed off Medicare Important Message given?   (If response is "NO", the following Medicare IM given date fields will be blank) Date Medicare IM given:   Date Additional Medicare IM given:    Discharge Disposition:  HOME W HOME HEALTH SERVICES  Per UR Regulation:    If discussed at Long Length of Stay Meetings, dates discussed:    Comments:  09/24/2012 1620 NCM referral to [facility]  for [facility]. Contact info added to dc instructions. DME ordered with [facility]. Will follow up on 7/19 for DME. Isidoro Donning RN CCM Case Mgmt phone [phone]  09/24/2012 1200 NCM provided pt with [facility] agency list. Waiting recommendation for [facility] and DME. Isidoro Donning RN CCM Case Mgmt phone [phone]

## 2012-09-25 NOTE — Progress Notes (Signed)
Orthopedics Progress Note  Subjective: Stable overnight, pain well controlled, did very well with PT  Objective:  Filed Vitals:   09/25/12 0540  BP: 124/76  Pulse: 80  Temp: 98.1 F (36.7 C)  Resp: 16    General: Awake and alert  Musculoskeletal: abdomen soft and nontender, lumbar corset in place, moves both legs well Neurovascularly intact, grade 5 motor bilateral LE  Lab Results  Component Value Date   WBC 5.1 09/22/2012   HGB 12.2* 09/22/2012   HCT 36.2* 09/22/2012   MCV 83.8 09/22/2012   PLT 217 09/22/2012       Component Value Date/Time   NA 133* 09/22/2012 0844   K 4.0 09/22/2012 0844   CL 96 09/22/2012 0844   CO2 25 09/22/2012 0844   GLUCOSE 99 09/22/2012 0844   BUN 7 09/22/2012 0844   CREATININE 0.90 09/22/2012 0844   CALCIUM 9.5 09/22/2012 0844   GFRNONAA >90 09/22/2012 0844   GFRAA >90 09/22/2012 0844    No results found for this basename: INR, PROTIME    Assessment/Plan: POD #2 s/p Procedure(s): TRANSFORAMINAL LUMBAR INTERBODY FUSION (TLIF) WITH PEDICLE SCREW FIXATION 1 LEVEL L5-S1 Patient doing great today, ok for D/C home, good family support Daily dry dressing change  Almedia Balls. Ranell Patrick, MD 09/25/2012 11:49 AM

## 2012-09-25 NOTE — Discharge Summary (Signed)
Physician Discharge Summary   Patient ID: Mark Good MRN: [id number] DOB/AGE: May 06, 1965 47 y.o.  Admit date: 09/23/2012 Discharge date: 09/25/2012  Admission Diagnoses:  Lumbar stenosis  Discharge Diagnoses:  Same   Surgeries: Procedure(s): TRANSFORAMINAL LUMBAR INTERBODY FUSION (TLIF) WITH PEDICLE SCREW FIXATION 1 LEVEL L5-S1 on 09/23/2012   Consultants: PT  Discharged Condition: Stable  Hospital Course: Mark Good is an 47 y.o. male who was admitted 09/23/2012 with a chief complaint of back and leg pain, and found to have a diagnosis of lumbar stenosis.  They were brought to the operating room on 09/23/2012 and underwent the above named procedures.    The patient had an uncomplicated hospital course and was stable for discharge.  Recent vital signs:  Filed Vitals:   09/25/12 0540  BP: 124/76  Pulse: 80  Temp: 98.1 F (36.7 C)  Resp: 16    Recent laboratory studies:  Results for orders placed during the hospital encounter of 09/22/12  SURGICAL PCR SCREEN      Result Value Range   MRSA, PCR NEGATIVE  NEGATIVE   Staphylococcus aureus NEGATIVE  NEGATIVE  BASIC METABOLIC PANEL      Result Value Range   Sodium 133 (*) 135 - 145 mEq/L   Potassium 4.0  3.5 - 5.1 mEq/L   Chloride 96  96 - 112 mEq/L   CO2 25  19 - 32 mEq/L   Glucose, Bld 99  70 - 99 mg/dL   BUN 7  6 - 23 mg/dL   Creatinine, Ser 1.91  0.50 - 1.35 mg/dL   Calcium 9.5  8.4 - 47.8 mg/dL   GFR calc non Af Amer >90  >90 mL/min   GFR calc Af Amer >90  >90 mL/min  CBC      Result Value Range   WBC 5.1  4.0 - 10.5 K/uL   RBC 4.32  4.22 - 5.81 MIL/uL   Hemoglobin 12.2 (*) 13.0 - 17.0 g/dL   HCT 29.5 (*) 62.1 - 30.8 %   MCV 83.8  78.0 - 100.0 fL   MCH 28.2  26.0 - 34.0 pg   MCHC 33.7  30.0 - 36.0 g/dL   RDW 65.7  84.6 - 96.2 %   Platelets 217  150 - 400 K/uL  TYPE AND SCREEN      Result Value Range   ABO/RH(D) O POS     Antibody Screen NEG     Sample Expiration 10/06/2012      Discharge  Medications:     Medication List    STOP taking these medications       BC HEADACHE PO     GOODY HEADACHE PO      TAKE these medications       ALPRAZolam 1 MG tablet  Commonly known as:  XANAX  Take 1 mg by mouth 3 (three) times daily as needed. For stress/anxiety     atenolol 100 MG tablet  Commonly known as:  TENORMIN  Take 100 mg by mouth daily.     clonazePAM 1 MG tablet  Commonly known as:  KLONOPIN  Take 1 mg by mouth 3 (three) times daily as needed for anxiety.     docusate sodium 100 MG capsule  Commonly known as:  COLACE  Take 1 capsule (100 mg total) by mouth 3 (three) times daily as needed for constipation.     DULoxetine 60 MG capsule  Commonly known as:  CYMBALTA  Take 60 mg by mouth daily.  FISH OIL PO  Take 2 capsules by mouth every evening.     gabapentin 600 MG tablet  Commonly known as:  NEURONTIN  Take 600 mg by mouth 2 (two) times daily.     levothyroxine 112 MCG tablet  Commonly known as:  SYNTHROID, LEVOTHROID  Take 112 mcg by mouth daily. Patient must have synthyroid brand. Generics are not effective.     niacin 1000 MG CR tablet  Commonly known as:  NIASPAN  Take 1,500 mg by mouth at bedtime.     ondansetron 4 MG tablet  Commonly known as:  ZOFRAN  Take 1 tablet (4 mg total) by mouth every 8 (eight) hours as needed for nausea.     oxyCODONE-acetaminophen 10-325 MG per tablet  Commonly known as:  PERCOCET  Take 1 tablet by mouth every 4 (four) hours as needed for pain.     pantoprazole 40 MG tablet  Commonly known as:  PROTONIX  Take 40 mg by mouth daily.     polyethylene glycol powder powder  Commonly known as:  GLYCOLAX  Take 17 g by mouth daily.     simvastatin 40 MG tablet  Commonly known as:  ZOCOR  Take 40 mg by mouth at bedtime.     VITAMIN B COMPLEX PO  Take 1 tablet by mouth daily.     VITAMIN D PO  Take 2 capsules by mouth daily.        Diagnostic Studies: Dg Lumbar Spine 2-3 Views  09/23/2012    *RADIOLOGY REPORT*  Clinical Data: L5 - S1 TLIF  DG C-ARM 61-120 MIN,LUMBAR SPINE - 2-3 VIEW  Comparison:  Lumbar spine radiographs - 09/23/2012.  Fluoroscopy time:  1 minute, 34 seconds  Findings:  2 spot fluoroscopic intraoperative images of the lower lumbar spine provided for review.  Image labeling is in keeping with preprocedural lumbar spine radiographs.  Post L5 - S1 paraspinal fusion and associated intervertebral disc space replacement.  There has been apparent restoration of the L5 - S1 intervertebral disc space.  No radiopaque foreign body.  IMPRESSION: Post L5 - S1 paraspinal fusion and intervertebral disc space replacement without definite evidence of complication.   Original Report Authenticated By: Tacey Ruiz, MD   Dg Lumbar Spine 2-3 Views  09/23/2012   **ADDENDUM** CREATED: 09/23/2012 07:14:53  **END ADDENDUM** SIGNED BY: Dineen Kid. Chestine Spore, M.D.  09/23/2012   **ADDENDUM** CREATED: 09/23/2012 07:14:53  **END ADDENDUM** SIGNED BY: Dineen Kid. Chestine Spore, M.D.  09/23/2012   *RADIOLOGY REPORT*  Clinical Data: Preop lumbar fusion  LUMBAR SPINE - 2-3 VIEW  Comparison: Lumbar MRI 06/19/2012  Findings: Five lumbar vertebral segments were numbered for preoperative planning purposes  Normal lumbar alignment.  Negative for fracture.  Disc degeneration and mild spurring L5-S1.  Remaining disc spaces are intact.  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   Original Report Authenticated By: Janeece Riggers, M.D.   Dg C-arm [phone] Min  09/23/2012   *RADIOLOGY REPORT*  Clinical Data: L5 - S1 TLIF  DG C-ARM 61-120 MIN,LUMBAR SPINE - 2-3 VIEW  Comparison:  Lumbar spine radiographs - 09/23/2012.  Fluoroscopy time:  1 minute, 34 seconds  Findings:  2 spot fluoroscopic intraoperative images of the lower lumbar spine provided for review.  Image labeling is in keeping with preprocedural lumbar spine radiographs.  Post L5 - S1 paraspinal fusion and associated intervertebral disc space replacement.  There has been  apparent restoration of the L5 - S1 intervertebral disc space.  No radiopaque foreign body.  IMPRESSION: Post L5 - S1 paraspinal fusion and intervertebral disc space replacement without definite evidence of complication.   Original Report Authenticated By: Tacey Ruiz, MD    Disposition: 01-Home or Self Care        Follow-up Information   Follow up with [facility]. (Home Health Physical Therapy and Occupational Therapy)    Contact information:   [phone]       Signed: Verlee Rossetti 09/25/2012, 12:10 PM

## 2013-01-13 ENCOUNTER — Other Ambulatory Visit: Payer: Self-pay

## 2013-09-22 ENCOUNTER — Other Ambulatory Visit: Payer: Self-pay | Admitting: Orthopedic Surgery

## 2013-09-22 DIAGNOSIS — Z981 Arthrodesis status: Secondary | ICD-10-CM

## 2013-10-14 ENCOUNTER — Other Ambulatory Visit: Payer: Self-pay | Admitting: Orthopedic Surgery

## 2013-10-14 ENCOUNTER — Ambulatory Visit
Admission: RE | Admit: 2013-10-14 | Discharge: 2013-10-14 | Disposition: A | Discharge status: Home / Self Care | Payer: Self-pay | Source: Ambulatory Visit | Attending: Orthopedic Surgery | Admitting: Orthopedic Surgery

## 2013-10-14 ENCOUNTER — Ambulatory Visit
Admission: RE | Admit: 2013-10-14 | Discharge: 2013-10-14 | Disposition: A | Discharge status: Home / Self Care | Payer: BC Managed Care – PPO | Source: Ambulatory Visit | Attending: Orthopedic Surgery | Admitting: Orthopedic Surgery

## 2013-10-14 DIAGNOSIS — R52 Pain, unspecified: Secondary | ICD-10-CM

## 2013-10-14 DIAGNOSIS — Z981 Arthrodesis status: Secondary | ICD-10-CM

## 2013-10-14 MED ORDER — DIAZEPAM 5 MG PO TABS
10.0000 mg | ORAL_TABLET | Freq: Once | ORAL | Status: AC
  Administered 2013-10-14: 10 mg via ORAL

## 2013-10-14 MED ORDER — IOHEXOL 180 MG/ML  SOLN
18.0000 mL | Freq: Once | INTRAMUSCULAR | Status: AC | PRN
  Administered 2013-10-14: 18 mL via INTRATHECAL

## 2013-10-14 NOTE — Progress Notes (Signed)
Patient states he has been off Zoloft for at least the past two days.  jkl

## 2013-10-14 NOTE — Discharge Instructions (Signed)
Myelogram Discharge Instructions  1. Go home and rest quietly for the next 24 hours.  It is important to lie flat for the next 24 hours.  Get up only to go to the restroom.  You may lie in the bed or on a couch on your back, your stomach, your left side or your right side.  You may have one pillow under your head.  You may have pillows between your knees while you are on your side or under your knees while you are on your back.  2. DO NOT drive today.  Recline the seat as far back as it will go, while still wearing your seat belt, on the way home.  3. You may get up to go to the bathroom as needed.  You may sit up for 10 minutes to eat.  You may resume your normal diet and medications unless otherwise indicated.  Drink lots of extra fluids today and tomorrow.  4. The incidence of headache, nausea, or vomiting is about 5% (one in 20 patients).  If you develop a headache, lie flat and drink plenty of fluids until the headache goes away.  Caffeinated beverages may be helpful.  If you develop severe nausea and vomiting or a headache that does not go away with flat bed rest, call [phone].  5. You may resume normal activities after your 24 hours of bed rest is over; however, do not exert yourself strongly or do any heavy lifting tomorrow. If when you get up you have a headache when standing, go back to bed and force fluids for another 24 hours.  6. Call your physician for a follow-up appointment.  The results of your myelogram will be sent directly to your physician by the following day.  7. If you have any questions or if complications develop after you arrive home, please call [phone].  Discharge instructions have been explained to the patient.  The patient, or the person responsible for the patient, fully understands these instructions.      May resume Zoloft on Aug. 8, 2015, after 9:30 am.

## 2013-12-22 ENCOUNTER — Ambulatory Visit (INDEPENDENT_AMBULATORY_CARE_PROVIDER_SITE_OTHER): Payer: BC Managed Care – PPO | Admitting: Neurology

## 2013-12-22 ENCOUNTER — Encounter: Payer: Self-pay | Admitting: Neurology

## 2013-12-22 VITALS — BP 114/69 | HR 59 | Temp 98.1°F | Ht 76.0 in | Wt 312.0 lb

## 2013-12-22 DIAGNOSIS — R1310 Dysphagia, unspecified: Secondary | ICD-10-CM

## 2013-12-22 DIAGNOSIS — G609 Hereditary and idiopathic neuropathy, unspecified: Secondary | ICD-10-CM

## 2013-12-22 DIAGNOSIS — R269 Unspecified abnormalities of gait and mobility: Secondary | ICD-10-CM

## 2013-12-22 NOTE — Progress Notes (Signed)
[facility]    Provider:  Dr Lucia Gaskins Referring Provider: Cain Saupe, MD Primary Care Physician:  Lenora Boys, MD  CC:  Foot pain  HPI:  Mark Good is a 48 y.o. male here as a referral from Dr. Jillyn Hidden for neuropathy. PMHx of HTn, HLD, depression, OSA  Left leg pain with injury started in 2010. Injured the left leg. Since then feels broke at the ankle. Worsening the last 2-3 years. 20 years ago was told he had DDD and last year with spinal fusion. It helped but now symptoms are back. Have tingling and buning the feet and both hands. Neck pain, LBP. Burning up to the kness and and in the hands to the wrists. "borderline diabetes" and hypothyroid.  In 2012 started feeling the burning in the feet and slowly progressed to the hands within 6 months. Balance is poor. Trips over the left foot. Has charlie horses in feet, calfs below knees. His whole hands have burning and tingling. Feels like there is no blood there. Wakes up and works it out. Swallowing problems, goes down the wrong pipe, chokes on food, no pneumonias. Has had falls, clumsiness and tripping.   No sig alcohol use, no history of medications that can cause neuropathy.  Takes neurontin for pain and as the day goes up his stress level goes up and his pain increases. Tried cymbalta   Not following with sleep doctor   Reviewed notes, labs and imaging from outside physicians, which showed: skin biopsy of area that wa sinjured in 2010 accident showed severely decreased nerve fiber density. Accident in 2010 with lawnmower, left calf exposed and resultant burning in that area.   Review of Systems: Patient complains of symptoms per HPI as well as the following symptoms fatigue, blurred vision, eye pain, cp, swelling, sob, cough, wheezing, snoring, increased thirst, trouble swallowing, constipation, joint pain and swelling, cramps, aching muscles, urination problems, allergies, memory loss, confusion, headache, numbness,  weakness, difficulty swallowing, depression, anxiety, decreased energy, disinterest, racing thoughts. Pertinent negatives per HPI. All others negative.   History   Social History  . Marital Status: Married    Spouse Name: Sonya    Number of Children: 2  . 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 Smoking status: Never Smoker   . Smokeless tobacco: Never Used  . Alcohol Use: Yes     Comment: drink once a month  . Drug Use: No  . Sexual Activity: Not on file   Other Topics Concern  . Not on file   Social History Narrative   Patient lives at home with spouse.   Caffeine Use: 3 cups daily    Family History  Problem Relation Age of Onset  . Fibromyalgia Mother   . Other Mother     back and knee issues  . Fibromyalgia Father   . Other Brother   . Other Brother   . Other Brother     Past Medical History  Diagnosis Date  . Hypertension   . Depression   . Anxiety   . Hypothyroidism   . Headache(784.0)   . Sleep apnea     uses cpap  . GERD (gastroesophageal reflux disease)     Past Surgical History  Procedure Laterality Date  . Nasal septoplasty w/ turbinoplasty    . Left leg repair of laceration    . Knee arthroscopy Right   .  Cardiac catheterization      Dr Tresa EndoKelly  . Lumbar fusion  09/23/2012    Dr Shon BatonBrooks    Current Outpatient Prescriptions  Medication Sig Dispense Refill  . Aspirin-Acetaminophen-Caffeine (GOODYS EXTRA STRENGTH PO) Take 4 packets by mouth daily.      Marland Kitchen. atenolol-chlorthalidone (TENORETIC) 50-25 MG per tablet Take 1 tablet by mouth daily.      . Cholecalciferol (VITAMIN D3) 2000 UNITS TABS Take 1 tablet by mouth daily.      . clonazePAM (KLONOPIN) 1 MG tablet Take 1 mg by mouth 3 (three) times daily as needed for anxiety.      . gabapentin (NEURONTIN) 600 MG tablet Take 600 mg by mouth 3 (three) times daily. Marland Kitchen.5-2 tablets tid      .  L-Methylfolate-Algae-B12-B6 (METANX) 3-90.314-2-35 MG CAPS Take 2 tablets by mouth daily.      Marland Kitchen. levothyroxine (SYNTHROID, LEVOTHROID) 112 MCG tablet Take 112 mcg by mouth daily. Patient must have synthyroid brand. Generics are not effective.      . Multiple Vitamins-Minerals (EMERGEN-C IMMUNE) PACK Take 1 packet by mouth daily.      . niacin (NIASPAN) 1000 MG CR tablet Take 1,500 mg by mouth at bedtime.        . pantoprazole (PROTONIX) 40 MG tablet Take 40 mg by mouth daily.      . sertraline (ZOLOFT) 100 MG tablet Take 2 tablets by mouth daily.      . simvastatin (ZOCOR) 40 MG tablet Take 40 mg by mouth at bedtime.        No current facility-administered medications for this visit.    Allergies as of 12/22/2013  . (No Known Allergies)    Vitals: BP 114/69  Pulse 59  Temp(Src) 98.1 F (36.7 C) (Oral)  Ht 6\' 4"  (1.93 m)  Wt 312 lb (141.522 kg)  BMI 37.99 kg/m2 Last Weight:  Wt Readings from Last 1 Encounters:  12/22/13 312 lb (141.522 kg)   Last Height:   Ht Readings from Last 1 Encounters:  12/22/13 6\' 4"  (1.93 m)    Physical exam: Exam: Gen: NAD, conversant, well nourised, obese, well groomed. Flat affect, appears sad.                   CV: RRR, no MRG. No Carotid Bruits. No peripheral edema, warm, nontender Eyes: Conjunctivae clear without exudates or hemorrhage  Neuro: Detailed Neurologic Exam  Speech:    Speech is normal; fluent and spontaneous with normal comprehension.  Cognition:    The patient is oriented to person, place, and time;     recent and remote memory intact;     language fluent;     normal attention, concentration,     fund of knowledge Cranial Nerves:    The pupils are equal, round, and reactive to light. The fundi are normal and spontaneous venous pulsations are present. Visual fields are full to finger confrontation. Extraocular movements are intact. Trigeminal sensation is intact and the muscles of mastication are normal. The face is  symmetric. The palate elevates in the midline. Voice is normal. Shoulder shrug is normal. The tongue has normal motion without fasciculations.   Coordination:    Normal finger to nose and heel to shin. Normal rapid alternating movements.   Gait:    Heel-toe and tandem gait are normal.   Motor Observation:    No asymmetry, no atrophy, and no involuntary movements noted. Tone:    Normal muscle tone.    Posture:  Posture is normal. normal erect    Strength:    Strength is V/V in the upper and lower limbs.      Sensation: Negative Romberg. impaired proprioception in great toes.  Impaired pp and temp to elbows and knees  Reflex Exam:  DTR's: Left absent achilles, otherwise deep tendon reflexes in the upper and lower extremities are normal bilaterally.   Toes:    The toes are downgoing bilaterally.   Clonus:    Clonus is absent.   Assessment/Plan:  48 year old obese gentleman with HTN, Hypothyroid, "borderline diabetes" who is here for evaluation of sensory changes in his feet and hands. Had an accident to left ankle and calf with subsequent focal neuropathy there however exam shows further decreased sensation in a glove-and-stocking distribution. Also seems very depressed with flat affect and has multiple other complaints such as myalgias, arthralgias, fatigue, difficulty swallowing pain, memory loss, weakness, headache and others. Suggest neuropathy screening, swallow study, PT gait and safety and an EMG/NCS. Continue Neurontin. Encouraged follow up with sleep physician for his OSA and continued psychiatric care. Suspect there is a large psychiatric component contributing to  his diffuse complaints.  Naomie DeanAntonia Ahern, MD  [facility] [address] [place], [place] [address]  Phone [phone] Fax [phone]

## 2013-12-22 NOTE — Patient Instructions (Signed)
Overall you are doing fairly well but I do want to suggest a few things today:   Remember to drink plenty of fluid, eat healthy meals and do not skip any meals. Try to eat protein with a every meal and eat a healthy snack such as fruit or nuts in between meals. Try to keep a regular sleep-wake schedule and try to exercise daily, particularly in the form of walking, 20-30 minutes a day, if you can.   As far as your medications are concerned, I would like to suggest: Continue Neurontin as prescribed  As far as diagnostic testing: labwork, EMG/NCS, speech and swallow, PT gait and safety  I would like to see you back for EMG/NCS, sooner if we need to. Please call us with any interim questions, concerns, problems, updates or refill requests.   Please also call us for any test results so we can go over those with you on the phone.  My clinical assistant and will answer any of your questions and relay your messages to me and also relay most of my messages to you.   Our phone number is [phone]. We also have an after hours call service for urgent matters and there is a physician on-call for urgent questions. For any emergencies you know to call 911 or go to the nearest emergency room

## 2013-12-23 ENCOUNTER — Other Ambulatory Visit: Payer: Self-pay

## 2013-12-23 LAB — LYME, TOTAL AB TEST/REFLEX

## 2013-12-23 LAB — PAN-ANCA
ANCA Proteinase 3: <3.5 U/mL (ref 0.0–3.5)
Atypical pANCA: <1:20 {titer}
Myeloperoxidase Ab: <9 U/mL (ref 0.0–9.0)
P-ANCA: <1:20 {titer}

## 2013-12-23 LAB — HEMOGLOBIN A1C
ESTIMATED AVERAGE GLUCOSE: 128 mg/dL
Hgb A1c MFr Bld: 6.1 % — ABNORMAL HIGH (ref 4.8–5.6)

## 2013-12-23 LAB — HIV ANTIBODY (ROUTINE TESTING W REFLEX): HIV-1/HIV-2 Ab: NONREACTIVE

## 2013-12-23 LAB — RHEUMATOID FACTOR: RHEUMATOID FACTOR: 9.7 [IU]/mL (ref 0.0–13.9)

## 2013-12-23 LAB — ANGIOTENSIN CONVERTING ENZYME: Angio Convert Enzyme: 25 U/L (ref 14–82)

## 2013-12-23 LAB — ANA W/REFLEX: Anti Nuclear Antibody(ANA): NEGATIVE

## 2013-12-23 LAB — VITAMIN B12: VITAMIN B 12: 967 pg/mL — AB (ref 211–946)

## 2013-12-23 LAB — TSH: TSH: 4.58 u[IU]/mL — AB (ref 0.450–4.500)

## 2013-12-23 LAB — RPR: RPR: NONREACTIVE

## 2013-12-25 ENCOUNTER — Encounter: Payer: Self-pay | Admitting: Neurology

## 2013-12-25 DIAGNOSIS — R1310 Dysphagia, unspecified: Secondary | ICD-10-CM | POA: Insufficient documentation

## 2013-12-25 DIAGNOSIS — G609 Hereditary and idiopathic neuropathy, unspecified: Secondary | ICD-10-CM | POA: Insufficient documentation

## 2013-12-25 DIAGNOSIS — R269 Unspecified abnormalities of gait and mobility: Secondary | ICD-10-CM | POA: Insufficient documentation

## 2014-01-02 ENCOUNTER — Encounter (INDEPENDENT_AMBULATORY_CARE_PROVIDER_SITE_OTHER): Payer: Self-pay

## 2014-01-02 ENCOUNTER — Ambulatory Visit (INDEPENDENT_AMBULATORY_CARE_PROVIDER_SITE_OTHER): Payer: BC Managed Care – PPO | Admitting: Neurology

## 2014-01-02 DIAGNOSIS — G609 Hereditary and idiopathic neuropathy, unspecified: Secondary | ICD-10-CM

## 2014-01-02 DIAGNOSIS — Z0289 Encounter for other administrative examinations: Secondary | ICD-10-CM

## 2014-01-02 NOTE — Progress Notes (Signed)
Positive waves abductir, giant and reduced,   [facility]   Provider: Dr Lucia GaskinsAhern  Referring Provider: Cain SaupeFulp, Cammie, MD  Primary Care Physician: Lenora BoysFRIED, ROBERT L, MD  CC: Foot pain   HPI: Gaspar ColaWilliam L Good is a 48 y.o. male here as a referral from Dr. Jillyn HiddenFulp for neuropathy. PMHx of HTN, HLD, depression, OSA. Left leg pain with injury started in 2010. Injured the left leg. Since then feels broke at the ankle. Worsening the last 2-3 years. 20 years ago was told he had DDD and last year with spinal fusion. It helped but now symptoms are back. Has tingling and burning in the feet and both hands. Neck pain, LBP. Burning up to the kness and and in the hands to the wrists. "borderline diabetes" and hypothyroid. In 2012 started feeling the burning in the feet and slowly progressed to the hands within 6 months. Balance is poor. Trips over the left foot. Has charlie horses in feet, calfs below knees. His whole hands have burning and tingling. Feels like there is no blood there. Wakes up and works it out. Swallowing problems, goes down the wrong pipe, chokes on food, no pneumonias. Has had falls, clumsiness and tripping.   Summary  Nerve conduction studies were performed on the bilateral upper and left lower extremities:  The bilateral Median motor nerves showed normal conductions with normal F Wave latencies The bilateral Ulnar motor nerve showed normal conductions with normal F Wave latencies  The left Peroneal motor nerve showed normal conductions with normal F Wave latency The left Tibial motor nerve showed normal conductions with normal F Wave latency  The bilateral second-digit Median sensory nerves were within normal limits The bilateral fifth-digit Ulnar sensory nerves were within normal limits The left Sural sensory nerve was within normal limits The left Peroneal sensory nerve was within normal limits  Bilateral H Reflexes showed normal latencies  EMG Needle study was performed on  selected  left lower extremity muscles:   The left Abductor Hallucis showed increased spontaneous activity, increased amplitude and decreased recruitment. The Vastus Medialis, Peroneus Longus, Anterior Tibialis, Medial Gastrocnemius, Extensor Hallucis Longus, Biceps Femoris (long head), Gluteus Maximus and Gluteus Medius muscles were within normal limits.  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 Acute/ongoing denervation and chronic neurogenic changes secondary to polyneuropathy were seen in one distal foot muscle. No suggestion of ulnar or median neuropathy or lumbar radiculopathy. Clinical Correlation recommended.    Naomie DeanAntonia Ahern, MD  [facility]  [address]  [place], [place] [id number]  Phone [phone] Fax [phone]

## 2014-05-29 IMAGING — CR DG LUMBAR SPINE 2-3V
3 series · 3 of 3 positions shown · non-contrast
Comparison: Lumbar MRI 06/19/2012

***ADDENDUM*** CREATED: 09/23/2012 [DATE]

a
***END ADDENDUM*** SIGNED BY: Elzbeta Miragliotta, M.D.
CLINICAL DATA: Preop lumbar fusion
LUMBAR SPINE - 2-3 VIEW

[t l-spine a.p.]
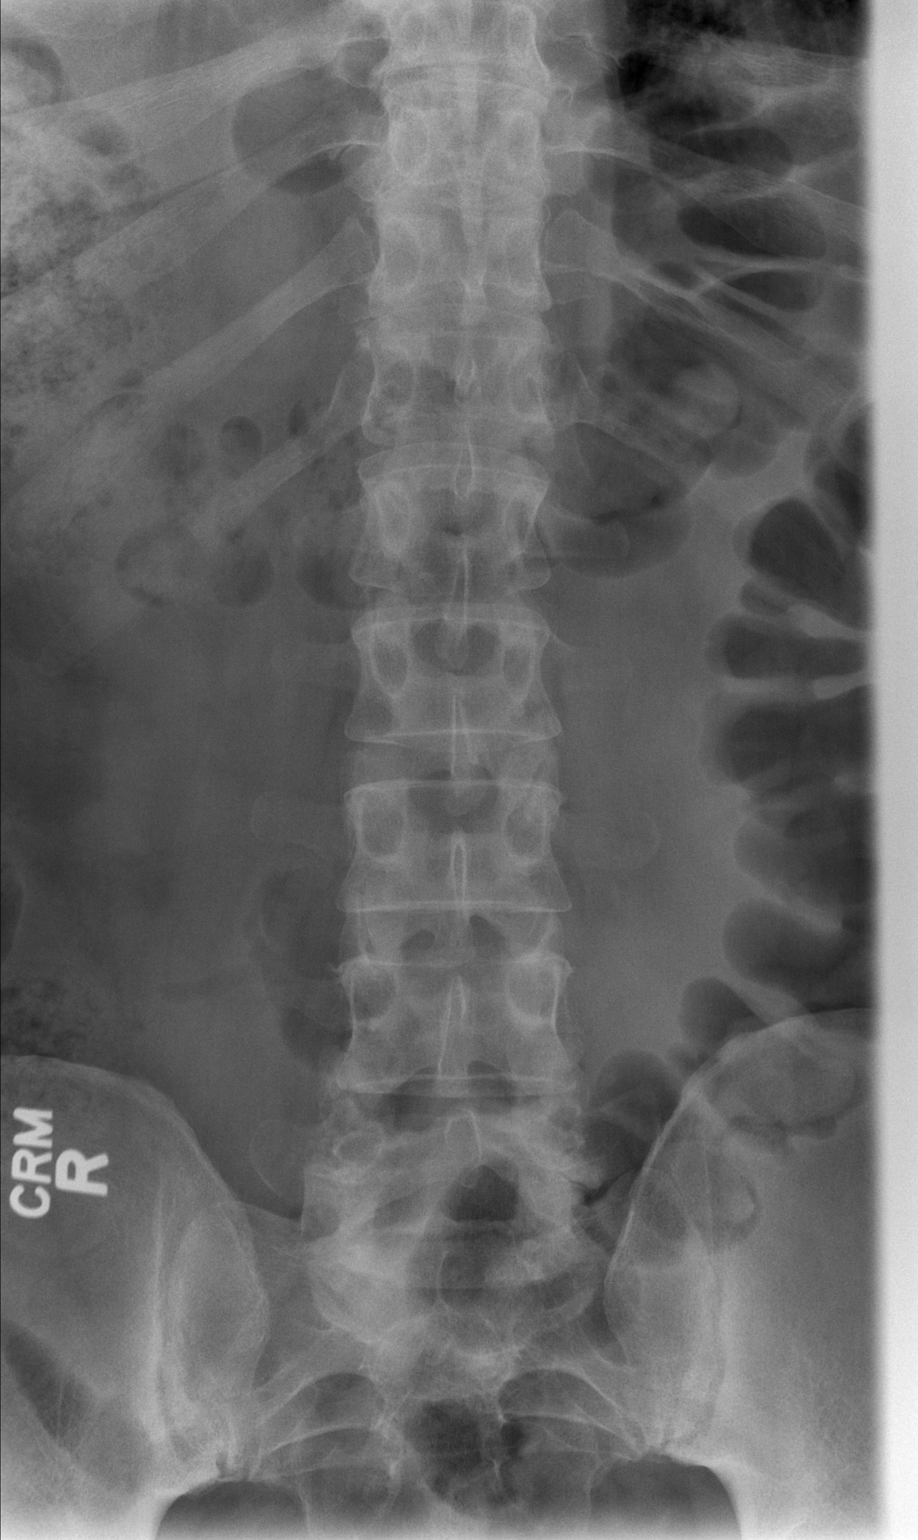

[t l-spine lat]
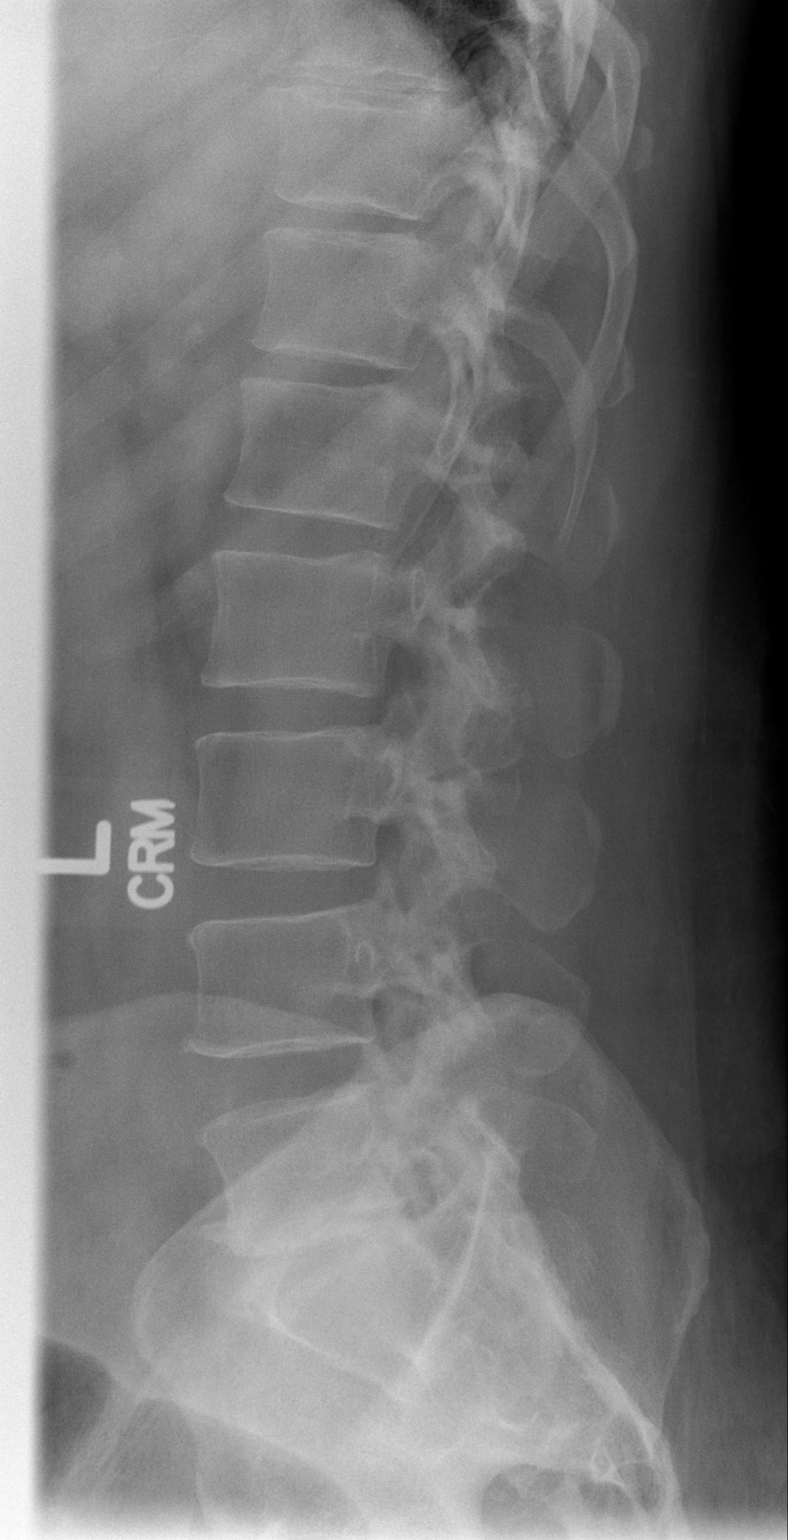

[t l-spine l5-s1 spot]
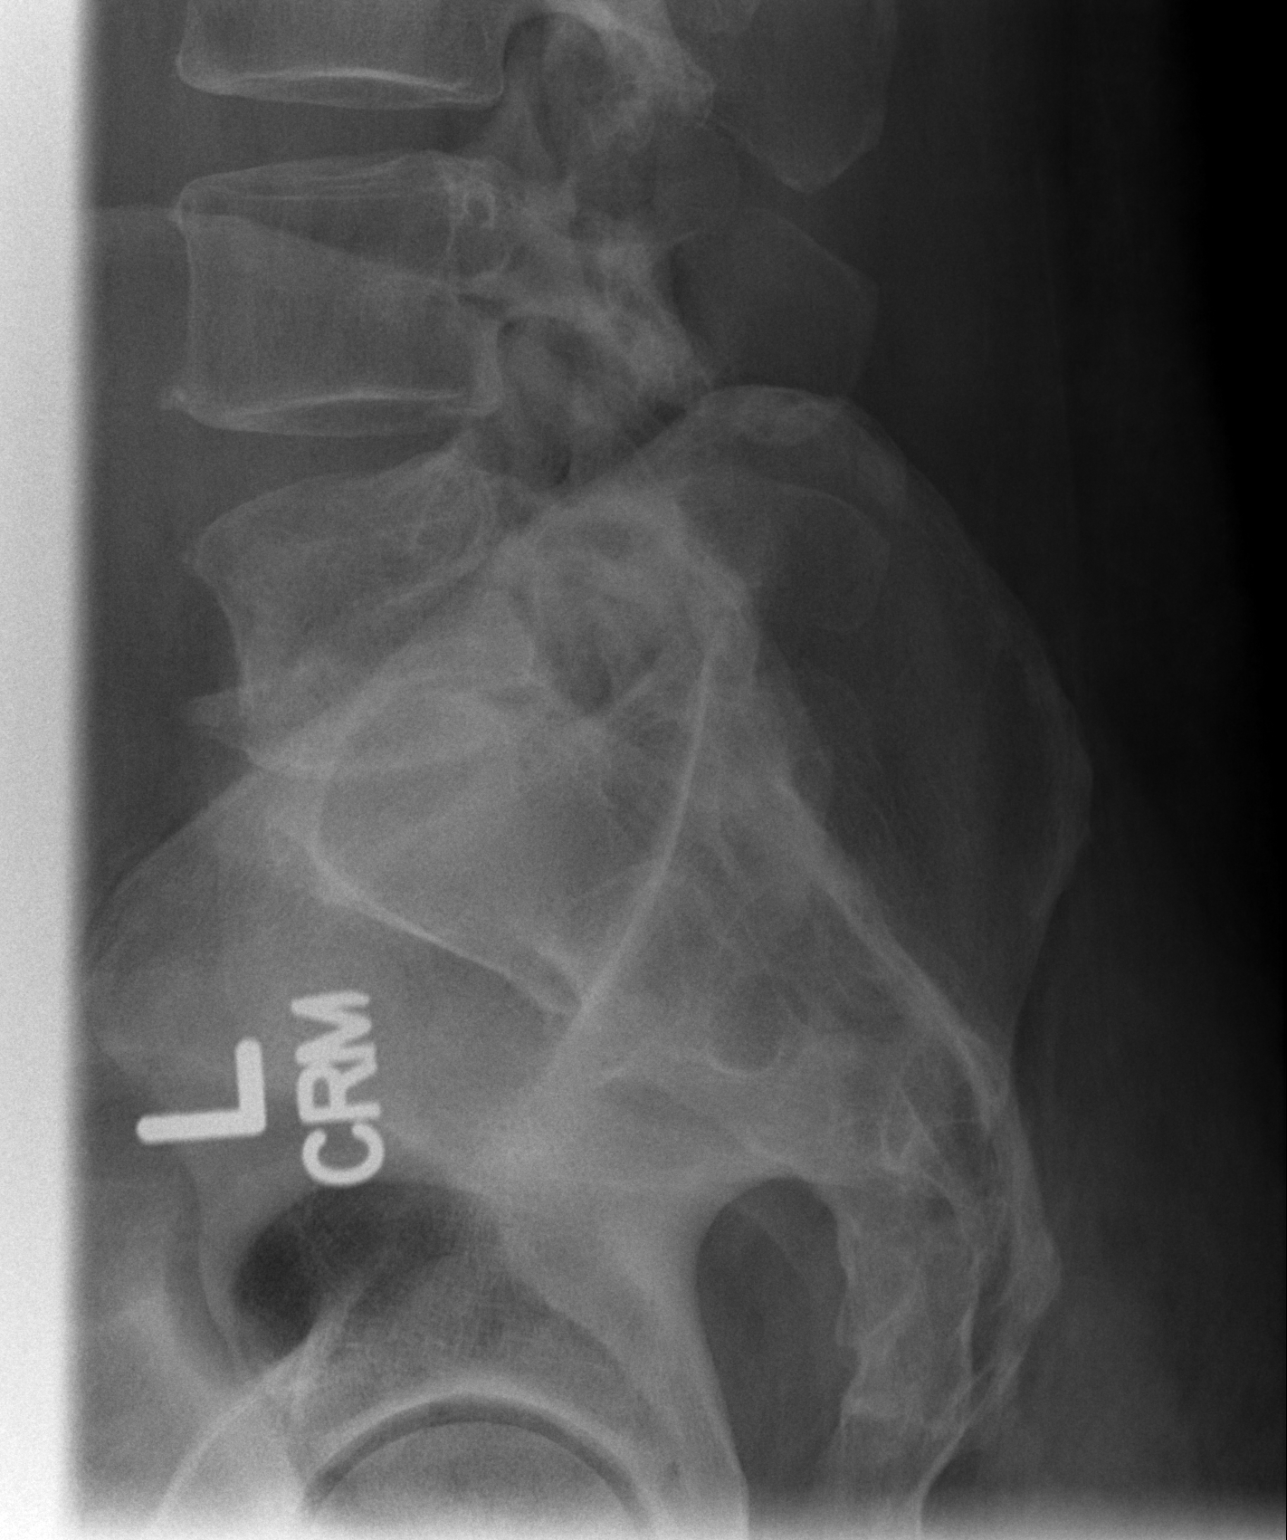

[3 of 3 positions shown; findings below may reference images not displayed]

FINDINGS: Five lumbar vertebral segments were numbered for
preoperative planning purposes

Normal lumbar alignment.  Negative for fracture.  Disc degeneration
and mild spurring L5-S1.  Remaining disc spaces are intact.
IMPRESSION: Disc degeneration and spondylosis at L5-1.

## 2015-01-10 DIAGNOSIS — G4733 Obstructive sleep apnea (adult) (pediatric): Secondary | ICD-10-CM | POA: Insufficient documentation

## 2015-01-10 DIAGNOSIS — G629 Polyneuropathy, unspecified: Secondary | ICD-10-CM | POA: Insufficient documentation

## 2015-02-19 DIAGNOSIS — I10 Essential (primary) hypertension: Secondary | ICD-10-CM | POA: Insufficient documentation

## 2015-05-10 ENCOUNTER — Ambulatory Visit (INDEPENDENT_AMBULATORY_CARE_PROVIDER_SITE_OTHER): Payer: BLUE CROSS/BLUE SHIELD | Admitting: Psychiatry

## 2015-05-10 ENCOUNTER — Encounter (HOSPITAL_COMMUNITY): Payer: Self-pay | Admitting: Psychiatry

## 2015-05-10 VITALS — BP 116/64 | HR 72 | Ht 76.0 in | Wt 316.0 lb

## 2015-05-10 DIAGNOSIS — K219 Gastro-esophageal reflux disease without esophagitis: Secondary | ICD-10-CM | POA: Insufficient documentation

## 2015-05-10 DIAGNOSIS — E039 Hypothyroidism, unspecified: Secondary | ICD-10-CM | POA: Insufficient documentation

## 2015-05-10 DIAGNOSIS — F411 Generalized anxiety disorder: Secondary | ICD-10-CM

## 2015-05-10 DIAGNOSIS — F0630 Mood disorder due to known physiological condition, unspecified: Secondary | ICD-10-CM | POA: Diagnosis not present

## 2015-05-10 DIAGNOSIS — F331 Major depressive disorder, recurrent, moderate: Secondary | ICD-10-CM | POA: Diagnosis not present

## 2015-05-10 MED ORDER — DULOXETINE HCL 30 MG PO CPEP: 30.0000 mg | ORAL_CAPSULE | Freq: Every day | ORAL | Status: DC

## 2015-05-10 MED ORDER — LAMOTRIGINE 25 MG PO TABS: 25.0000 mg | ORAL_TABLET | Freq: Every day | ORAL | Status: DC

## 2015-05-10 NOTE — Progress Notes (Signed)
Psychiatric Initial Adult Assessment   Patient Identification: Mark Good MRN:  [id number] Date of Evaluation:  05/10/2015 Referral Source: [facility], primary care Chief Complaint:   Chief Complaint    Establish Care     Visit Diagnosis:    ICD-9-CM ICD-10-CM   1. Moderate episode of recurrent major depressive disorder (HCC) 296.32 F33.1   2. GAD (generalized anxiety disorder) 300.02 F41.1   3. Mood disorder in conditions classified elsewhere 293.83 F06.30    Diagnosis:   Patient Active Problem List   Diagnosis Date Noted  . Acid reflux [K21.9] 05/10/2015  . Adult hypothyroidism [E03.9] 05/10/2015  . Essential (primary) hypertension [I10] 02/19/2015  . Neuropathy (HCC) [G62.9] 01/10/2015  . Obstructive apnea [G47.33] 01/10/2015  . Hereditary and idiopathic peripheral neuropathy [G60.9] 12/25/2013  . Dysphagia [R13.10] 12/25/2013  . Abnormality of gait [R26.9] 12/25/2013   History of Present Illness:  50 years old currently married Caucasian male referred for management of depression.  Patient is currently on Cymbalta. Some improvement since change from Abilify and Zoloft. 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 Also endorses excessive worries unreasonable at times muscle tightening and difficulty sleeping at night Does not endorse illusions hallucinations paranoia no significant history of mania.  Aggravating factors; not satisfied with the job. Difficulty focusing. Mom died generally 2015-07-20 Modifying factors; supportive wife for the last 20 years. Grown kids Medical complexity: spinal fusion surgery in past. DDD. Hypothyroid and sleep apnea. Location;  Depression, tiredness, anxiety Severity of depression; 4 out of 10. 10 being no depression Context;  feels stagnated feels what is the purpose of life at times Duration; 5-10 years Timing; variable  Associated Signs/Symptoms: Depression Symptoms:  depressed mood, anhedonia, difficulty concentrating, anxiety, loss of energy/fatigue, disturbed sleep, (Hypo) Manic Symptoms:  Distractibility, Anxiety Symptoms:  Excessive Worry, Psychotic Symptoms:  denies PTSD Symptoms: NA Past Psychiatric History:  IOP program Jul 20, 2010. At that time also had severe depression but no suicide attempt. No prior psychiatric admission Has been on different medications including Klonopin in the past. Abilify and Wellbutrin, Zoloft Followed with psychiatrist Dr.  Evelene Croon  lately by primary care physician prescribing psychotropic medications Past Medical History:  Past Medical History  Diagnosis Date  . Hypertension   . Depression   . Anxiety   . Hypothyroidism   . Headache(784.0)   . Sleep apnea     uses cpap  . GERD (gastroesophageal reflux disease)     Past Surgical History  Procedure Laterality Date  . Nasal septoplasty w/ turbinoplasty    . Left leg repair of laceration    . Knee arthroscopy Right   . Cardiac catheterization      Dr Tresa Endo  . Lumbar fusion  09/23/2012    Dr Shon Baton   Family History:  Family History  Problem Relation Age of Onset  . Fibromyalgia Mother   . Other Mother     back and knee issues  . Fibromyalgia Father   . Other Brother   . Other Brother   . Other Brother    Social History:   Social History   Social History  . Marital Status: Married    Spouse Name: Lamar Laundry  . Number of Children: 2  . Years of Education: Assoc   Occupational History  . SERVICE ENGINEER TECH [employer]   Social History Main Topics  . Smoking status: Never Smoker   . Smokeless tobacco: Never Used  . Alcohol Use: Yes     Comment: drink once a month  . Drug Use: No  . Sexual Activity: Not Asked   Other Topics Concern  . None   Social History Narrative   Patient lives at  home with spouse.   Caffeine Use: 3 cups daily   Additional Social History: Grew up with his parents 3 sibling brothers somewhat rough financially growing up. Completed his GED. Married for the last 20 years currently working customer service at [employer] 2 grown kids reported no legal issue.  Musculoskeletal: Strength & Muscle Tone: within normal limits Gait & Station: normal Patient leans: N/A  Psychiatric Specialty Exam: HPI  Review of Systems  Constitutional: Positive for malaise/fatigue. Negative for fever.  Cardiovascular: Negative for chest pain.  Skin: Negative for rash.  Neurological: Negative for tremors.  Psychiatric/Behavioral: Positive for depression. Negative for suicidal ideas and substance abuse. The patient is nervous/anxious.     Blood pressure 116/64, pulse 72, height 6\' 4"  (1.93 m), weight 316 lb (143.337 kg), SpO2 94 %.Body mass index is 38.48 kg/(m^2).  General Appearance: Casual  Eye Contact:  Fair  Speech:  Normal Rate  Volume:  Normal  Mood:  Dysphoric  Affect:  Congruent and Constricted  Thought Process:  Coherent  Orientation:  Full (Time, Place, and Person)  Thought Content:  Rumination  Suicidal Thoughts:  No  Homicidal Thoughts:  No  Memory:  Immediate;   Fair Recent;   Fair  Judgement:  Fair  Insight:  Shallow  Psychomotor Activity:  Decreased  Concentration:  Fair  Recall:  [employer] of Knowledge:Fair  Language: Fair  Akathisia:  Negative  Handed:  Right  AIMS (if indicated):    Assets:  Desire for Improvement Social Support  ADL's:  Intact  Cognition: WNL  Sleep:  variable   Is the patient at risk to self?  No. Has the patient been a risk to self in the past 6 months?  No. Has the patient been a risk to self within the distant past?  No. Is the patient a risk to others?  No. Has the patient been a risk to others in the past 6 months?  No. Has the patient been a risk to others within the distant past?  No.  Allergies:   Allergies   Allergen Reactions  . Bupropion Other (See Comments)    Headaches   Current Medications: Current Outpatient Prescriptions  Medication Sig Dispense Refill  . ALPRAZolam (XANAX) 0.5 MG tablet Take 0.5 mg by mouth 2 (two) times daily as needed.  1  . Aspirin-Acetaminophen-Caffeine (GOODYS EXTRA STRENGTH PO) Take 4 packets by mouth daily.    Marland Kitchen atenolol-chlorthalidone (TENORETIC) 50-25 MG per tablet Take 1 tablet by mouth daily.    . Cholecalciferol (VITAMIN D3) 2000 UNITS TABS Take 1 tablet by mouth daily.    . DULoxetine (CYMBALTA) 60 MG capsule Take by mouth.    . fluticasone (FLONASE) 50 MCG/ACT nasal spray     . gabapentin (NEURONTIN) 600 MG tablet Take 600 mg by mouth 2 (two) times daily. Reported on 05/10/2015    . levothyroxine (SYNTHROID) 150 MCG tablet Take by mouth.    . pantoprazole (PROTONIX) 40 MG tablet Take 40 mg by mouth daily.    . vitamin B-12 (CYANOCOBALAMIN) 1000 MCG tablet Take by mouth.    . DULoxetine (CYMBALTA) 30 MG  capsule Take 1 capsule (30 mg total) by mouth daily. This is in addition to  he is already taking.  Total dose will be 90 mg. 30 capsule 0  . lamoTRIgine (LAMICTAL) 25 MG tablet Take 1 tablet (25 mg total) by mouth daily. Take one tablet daily for a week and then start taking 2 tablets. 60 tablet 0   No current facility-administered medications for this visit.    Previous Psychotropic Medications: Yes  See HPI. wellbutrin caused headaches zoloft and abilify didn't seem to help  Substance Abuse History in the last 12 months:  No.  Alcohol use beer at times but sporadic. No regular use.  Consequences of Substance Abuse: NA  Medical Decision Making:  Review of Psycho-Social Stressors (1), Review of Medication Regimen & Side Effects (2) and Review of New Medication or Change in Dosage (2)  Treatment Plan Summary: Medication management and Plan as follows  Major depression: Currently in 60 minute on Cymbalta. We will add 30 mg more since he has  benefited some from Cymbalta on the reported side effects. Will add lamictal 25 mg as a most unpleasant increase it to 50 Next 1 week. Discussed various side effects including the rash. Family history of bipolar but there is no clear history of mania or bipolar and his history Generalized anxiety disorder; continue Cymbalta as above. Otherwise to cut down on Xanax and discontinue next 3to 4 weeks. Discussed possibility of tolerance and dependence and complications of being on it long-term Mood disorder; possibly related with sleep apnea, hypothyroid, back condition contributing to the mood symptoms and feeling frustrated at work. Also possible related with not having job satisfaction. We talked about possibility of adding some more activities something like hobbies or even certification in case if he wants to change his job.  Will add lamictal as mood stabilizer Sleep: reviewed sleep hygiene. cpap compliance. Go to bed somewhat late  Reviewed weight maintenance and physical activity encouraged to add more physical activity More than 50% time spent in counseling and coordination of care including patient education. Wife is also hearing collaborated with information and treatment plan Call 911 or report local emergency room for any urgent concerns or suicidal thoughts Follow-up in 3-4 weeks or earlier if needed      Ilias Stcharles 3/2/20179:32 AM

## 2015-05-21 ENCOUNTER — Telehealth (HOSPITAL_COMMUNITY): Payer: Self-pay | Admitting: *Deleted

## 2015-05-24 ENCOUNTER — Ambulatory Visit (HOSPITAL_COMMUNITY): Payer: Self-pay | Admitting: Licensed Clinical Social Worker

## 2015-06-11 ENCOUNTER — Other Ambulatory Visit (HOSPITAL_COMMUNITY): Payer: Self-pay | Admitting: Psychiatry

## 2015-06-12 ENCOUNTER — Ambulatory Visit (INDEPENDENT_AMBULATORY_CARE_PROVIDER_SITE_OTHER): Payer: BLUE CROSS/BLUE SHIELD | Admitting: Psychiatry

## 2015-06-12 ENCOUNTER — Encounter (HOSPITAL_COMMUNITY): Payer: Self-pay | Admitting: Psychiatry

## 2015-06-12 VITALS — BP 126/72 | HR 53 | Ht 76.0 in | Wt 314.0 lb

## 2015-06-12 DIAGNOSIS — F331 Major depressive disorder, recurrent, moderate: Secondary | ICD-10-CM

## 2015-06-12 DIAGNOSIS — F0630 Mood disorder due to known physiological condition, unspecified: Secondary | ICD-10-CM | POA: Diagnosis not present

## 2015-06-12 DIAGNOSIS — F411 Generalized anxiety disorder: Secondary | ICD-10-CM | POA: Diagnosis not present

## 2015-06-12 MED ORDER — DULOXETINE HCL 30 MG PO CPEP: 30.0000 mg | ORAL_CAPSULE | Freq: Every day | ORAL | Status: DC

## 2015-06-12 MED ORDER — DULOXETINE HCL 60 MG PO CPEP: 60.0000 mg | ORAL_CAPSULE | Freq: Every day | ORAL | Status: DC

## 2015-06-12 MED ORDER — LAMOTRIGINE 25 MG PO TABS: 25.0000 mg | ORAL_TABLET | Freq: Every day | ORAL | Status: DC

## 2015-06-12 NOTE — Progress Notes (Signed)
Patient ID: Mark Good, male   DOB: April 02, 1965, 50 y.o.   MRN: [id number]  [facility] Outpatient Follow up visit   Patient Identification: Mark Good MRN:  [id number] Date of Evaluation:  06/12/2015 Referral Source: [facility], primary care Chief Complaint:   Chief Complaint    Follow-up     Visit Diagnosis:    ICD-9-CM ICD-10-CM   1. Moderate episode of recurrent major depressive disorder (HCC) 296.32 F33.1   2. GAD (generalized anxiety disorder) 300.02 F41.1   3. Mood disorder in conditions classified elsewhere 293.83 F06.30    Diagnosis:   Patient Active Problem List   Diagnosis Date Noted  . Acid reflux [K21.9] 05/10/2015  . Adult hypothyroidism [E03.9] 05/10/2015  . Essential (primary) hypertension [I10] 02/19/2015  . Neuropathy (HCC) [G62.9] 01/10/2015  . Obstructive apnea [G47.33] 01/10/2015  . Hereditary and idiopathic peripheral neuropathy [G60.9] 12/25/2013  . Dysphagia [R13.10] 12/25/2013  . Abnormality of gait [R26.9] 12/25/2013   History of Present Illness:  50 years old currently married Caucasian male initially referred for management of depression.  Last visit we added 30 mg to 60 mg Cymbalta for depression. Added Lamictal for augmentation for depression symptoms he did not increase it to 50 mg and has kept to  instead. Still not happy with job but looking around.  Has stopped taking xanax 2 weeks ago . No withdrawals.  Some less tiredness noted by him.  No side effects. Sleep remains variably disturbed. Anxiety has improved but remains distracted at times at work.  Aggravating factors; not satisfied with the job. Difficulty focusing. Mom died generally 07/16/15 Modifying factors; supportive wife for the last 20 years. Grown kids Medical complexity: spinal fusion surgery in past. DDD. Hypothyroid and sleep apnea. Location;  Depression, tiredness, anxiety Severity of depression; 5 out of 10. 10 being no depression Context; feels stagnated feels what is the purpose  of life at times Duration; 5-10 years Timing; variable  (Hypo) Manic Symptoms:  Distractibility, (no improvement) Anxiety Symptoms:  Excessive Worry,(some improvement) Psychotic Symptoms:  denies PTSD Symptoms: NA  Past Medical History:  Past Medical History  Diagnosis Date  . Hypertension   . Depression   . Anxiety   . Hypothyroidism   . Headache(784.0)   . Sleep apnea     uses cpap  . GERD (gastroesophageal reflux disease)     Past Surgical History  Procedure Laterality Date  . Nasal septoplasty w/ turbinoplasty    . Left leg repair of laceration    . Knee arthroscopy Right   . Cardiac catheterization      Dr Tresa Endo  . Lumbar fusion  09/23/2012    Dr Shon Baton   Family History:  Family History  Problem Relation Age of Onset  . Fibromyalgia Mother   . Other Mother     back and knee issues  . Fibromyalgia Father   . Other Brother   . Other Brother   . Other Brother    Social History:   Social History   Social History  . Marital Status: Married    Spouse Name: Lamar Laundry  . Number of Children: 2  . 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 Smoking status: Never Smoker   . Smokeless tobacco: Never Used  . Alcohol Use: Yes     Comment: drink once a month  . Drug Use: No  . Sexual Activity: Not Asked  Other Topics Concern  . None   Social History Narrative   Patient lives at home with spouse.   Caffeine Use: 3 cups daily     Musculoskeletal: Strength & Muscle Tone: within normal limits Gait & Station: normal Patient leans: N/A  Psychiatric Specialty Exam: HPI  Review of Systems  Constitutional: Positive for malaise/fatigue. Negative for fever.  Cardiovascular: Negative for chest pain.  Skin: Negative for rash.  Neurological: Negative for tremors.  Psychiatric/Behavioral: Positive for depression. Negative for suicidal ideas and substance abuse.    Blood  pressure 126/72, pulse 53, height 6\' 4"  (1.93 m), weight 314 lb (142.429 kg), SpO2 96 %.Body mass index is 38.24 kg/(m^2).  General Appearance: Casual  Eye Contact:  Fair  Speech:  Normal Rate  Volume:  Normal  Mood:  Somewhat dysphoric  Affect:  Congruent and Constricted  Thought Process:  Coherent  Orientation:  Full (Time, Place, and Person)  Thought Content:  Rumination  Suicidal Thoughts:  No  Homicidal Thoughts:  No  Memory:  Immediate;   Fair Recent;   Fair  Judgement:  Fair  Insight:  Shallow  Psychomotor Activity:  Decreased  Concentration:  Fair  Recall:  [employer]  Fund of Knowledge:Fair  Language: Fair  Akathisia:  Negative  Handed:  Right  AIMS (if indicated):    Assets:  Desire for Improvement Social Support  ADL's:  Intact  Cognition: WNL  Sleep:  variable   Is the patient at risk to self?  No. Has the patient been a risk to self in the past 6 months?  No.  Allergies:   Allergies  Allergen Reactions  . Bupropion Other (See Comments)    Headaches   Current Medications: Current Outpatient Prescriptions  Medication Sig Dispense Refill  . Aspirin-Acetaminophen-Caffeine (GOODYS EXTRA STRENGTH PO) Take 4 packets by mouth daily.    Marland Kitchen. atenolol-chlorthalidone (TENORETIC) 50-25 MG per tablet Take 1 tablet by mouth daily.    . Cholecalciferol (VITAMIN D3) 2000 UNITS TABS Take 1 tablet by mouth daily.    . DULoxetine (CYMBALTA) 30 MG capsule Take 1 capsule (30 mg total) by mouth daily. This is in addition to 60mg  he is already taking.  Total dose will be 90 mg. 30 capsule 0  . DULoxetine (CYMBALTA) 60 MG capsule Take 1 capsule (60 mg total) by mouth daily. This is in addition to 30mg  he is taking. Total dose will be 90 mg per day 30 capsule 0  . fluticasone (FLONASE) 50 MCG/ACT nasal spray     . gabapentin (NEURONTIN) 600 MG tablet Take 600 mg by mouth 2 (two) times daily. Reported on 05/10/2015    . lamoTRIgine (LAMICTAL) 25 MG tablet Take 1 tablet (25 mg total) by  mouth daily. Take 2 rablets for one week. Then start taking 3  Per day. 90 tablet 0  . levothyroxine (SYNTHROID) 150 MCG tablet Take by mouth.    . pantoprazole (PROTONIX) 40 MG tablet Take 40 mg by mouth daily.    . vitamin B-12 (CYANOCOBALAMIN) 1000 MCG tablet Take by mouth.     No current facility-administered medications for this visit.    Previous Psychotropic Medications: Yes  See HPI. wellbutrin caused headaches zoloft and abilify didn't seem to help   Treatment Plan Summary: Medication management and Plan as follows  Major depression:  Continue cymbalta 30 plus 60 Advised to increase lamictal to 50mg  and then 75 mg in one week. Has stopped xanax.  Generalized anxiety disorder; continue Cymbalta as above.  Mood disorder; possibly related with sleep apnea, hypothyroid, back condition contributing to the mood symptoms and feeling frustrated at work. Also possible related with not having job satisfaction. Sleep: reviewed sleep hygiene. cpap compliance. Go to bed somewhat late  Reviewed weight maintenance and physical activity encouraged to add more physical activity More than 50% time spent in counseling and coordination of care including patient education. Wife is also hearing collaborated with information and treatment plan Call 911 or report local emergency room for any urgent concerns or suicidal thoughts Follow-up in 3-4 weeks or earlier if needed  Time spent: 25 minutes.     Sibbie Flammia 4/4/20172:49 PM

## 2015-07-05 ENCOUNTER — Ambulatory Visit (HOSPITAL_COMMUNITY): Payer: Self-pay | Admitting: Psychiatry

## 2015-07-09 ENCOUNTER — Encounter (HOSPITAL_COMMUNITY): Payer: Self-pay | Admitting: Psychiatry

## 2015-07-09 ENCOUNTER — Ambulatory Visit (INDEPENDENT_AMBULATORY_CARE_PROVIDER_SITE_OTHER): Payer: BLUE CROSS/BLUE SHIELD | Admitting: Psychiatry

## 2015-07-09 VITALS — BP 128/72 | HR 56 | Ht 76.0 in | Wt 313.0 lb

## 2015-07-09 DIAGNOSIS — F0630 Mood disorder due to known physiological condition, unspecified: Secondary | ICD-10-CM | POA: Diagnosis not present

## 2015-07-09 DIAGNOSIS — F411 Generalized anxiety disorder: Secondary | ICD-10-CM | POA: Diagnosis not present

## 2015-07-09 DIAGNOSIS — F331 Major depressive disorder, recurrent, moderate: Secondary | ICD-10-CM | POA: Diagnosis not present

## 2015-07-09 MED ORDER — LAMOTRIGINE 100 MG PO TABS: 100.0000 mg | ORAL_TABLET | Freq: Every day | ORAL | Status: DC

## 2015-07-09 MED ORDER — DULOXETINE HCL 60 MG PO CPEP: 60.0000 mg | ORAL_CAPSULE | Freq: Every day | ORAL | Status: DC

## 2015-07-09 MED ORDER — DULOXETINE HCL 30 MG PO CPEP: 30.0000 mg | ORAL_CAPSULE | Freq: Every day | ORAL | Status: DC

## 2015-07-09 NOTE — Progress Notes (Signed)
Patient ID: SIRUS LABRIE, male   DOB: [birth year], 50 y.o.   MRN: [id number]  [facility] Outpatient Follow up visit   Patient Identification: Mark Good MRN:  [id number] Date of Evaluation:  07/09/2015 Referral Source: [facility], primary care Chief Complaint:   Chief Complaint    Follow-up     Visit Diagnosis:    ICD-9-CM ICD-10-CM   1. Moderate episode of recurrent major depressive disorder (HCC) 296.32 F33.1   2. GAD (generalized anxiety disorder) 300.02 F41.1   3. Mood disorder in conditions classified elsewhere 293.83 F06.30    Diagnosis:   Patient Active Problem List   Diagnosis Date Noted  . Acid reflux [K21.9] 05/10/2015  . Adult hypothyroidism [E03.9] 05/10/2015  . Essential (primary) hypertension [I10] 02/19/2015  . Neuropathy (HCC) [G62.9] 01/10/2015  . Obstructive apnea [G47.33] 01/10/2015  . Hereditary and idiopathic peripheral neuropathy [G60.9] 12/25/2013  . Dysphagia [R13.10] 12/25/2013  . Abnormality of gait [R26.9] 12/25/2013   History of Present Illness:  50 years old currently married Caucasian male initially referred for management of depression.  Adding Lamictal has been helpful he has increase it now to 100 mg. Mood was somewhat better he has some limitation because of his back condition that frustrates him at times. He is looking for something that he can function otherwise as of now keeps himself busy with yardwork No more xanax for last month. No withdrawals.  Some less tiredness noted by him.  No side effects. Sleep remains variabe Anxiety has not worsened, but remains distracted at times at work.  Aggravating factors; not satisfied with the job. Difficulty focusing at times. Mom's death.  Modifying factors; supportive wife for the last 20 years. Grown kids Medical complexity: spinal fusion surgery in past. DDD. Hypothyroid and sleep apnea. Location;  Severity of depression; 6 out of 10. 10 being no depression Context; feels stagnated at time with work.   Duration; 5-10 years Timing; variable  (Hypo) Manic Symptoms:  Distractibility, (some improvment) Anxiety Symptoms:  Excessive Worry,(some improvement) Psychotic Symptoms:  denies PTSD Symptoms: NA  Past Medical History:  Past Medical History  Diagnosis Date  . Hypertension   . Depression   . Anxiety   . Hypothyroidism   . Headache(784.0)   . Sleep apnea     uses cpap  . GERD (gastroesophageal reflux disease)     Past Surgical History  Procedure Laterality Date  . Nasal septoplasty w/ turbinoplasty    . Left leg repair of laceration    . Knee arthroscopy Right   . Cardiac catheterization      Dr Tresa Endo  . Lumbar fusion  09/23/2012    Dr Shon Baton   Family History:  Family History  Problem Relation Age of Onset  . Fibromyalgia Mother   . Other Mother     back and knee issues  . Fibromyalgia Father   . Other Brother   . Other Brother   . Other Brother    Social History:   Social History   Social History  . Marital Status: Married    Spouse Name: Lamar Laundry  . Number of Children: 2  . 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 Smoking status: Never Smoker   . Smokeless tobacco: Never Used  . Alcohol Use: Yes     Comment: drink once a month  . Drug Use: No  . Sexual Activity: Not Asked   Other  Topics Concern  . None   Social History Narrative   Patient lives at home with spouse.   Caffeine Use: 3 cups daily     Musculoskeletal: Strength & Muscle Tone: within normal limits Gait & Station: normal Patient leans: N/A  Psychiatric Specialty Exam: HPI  Review of Systems  Constitutional: Positive for malaise/fatigue. Negative for fever.  Cardiovascular: Negative for chest pain.  Skin: Negative for rash.  Neurological: Negative for tremors.  Psychiatric/Behavioral: Negative for suicidal ideas and substance abuse.    Blood pressure 128/72, pulse 56, height 6\' 4"  (1.93  m), weight 313 lb (141.976 kg), SpO2 98 %.Body mass index is 38.12 kg/(m^2).  General Appearance: Casual  Eye Contact:  Fair  Speech:  Normal Rate  Volume:  Normal  Mood:  Less dysphoric  Affect:  Congruent and Constricted  Thought Process:  Coherent  Orientation:  Full (Time, Place, and Person)  Thought Content:  Rumination  Suicidal Thoughts:  No  Homicidal Thoughts:  No  Memory:  Immediate;   Fair Recent;   Fair  Judgement:  Fair  Insight:  Shallow  Psychomotor Activity:  Decreased  Concentration:  Fair  Recall:  [employer]  Fund of Knowledge:Fair  Language: Fair  Akathisia:  Negative  Handed:  Right  AIMS (if indicated):    Assets:  Desire for Improvement Social Support  ADL's:  Intact  Cognition: WNL  Sleep:  variable   Is the patient at risk to self?  No. Has the patient been a risk to self in the past 6 months?  No.  Allergies:   Allergies  Allergen Reactions  . Bupropion Other (See Comments)    Headaches   Current Medications: Current Outpatient Prescriptions  Medication Sig Dispense Refill  . Aspirin-Acetaminophen-Caffeine (GOODYS EXTRA STRENGTH PO) Take 4 packets by mouth daily.    Marland Kitchen. atenolol-chlorthalidone (TENORETIC) 50-25 MG per tablet Take 1 tablet by mouth daily.    . Cholecalciferol (VITAMIN D3) 2000 UNITS TABS Take 1 tablet by mouth daily.    . DULoxetine (CYMBALTA) 30 MG capsule Take 1 capsule (30 mg total) by mouth daily. This is in addition to 60mg  he is already taking.  Total dose will be 90 mg. 30 capsule 0  . DULoxetine (CYMBALTA) 60 MG capsule Take 1 capsule (60 mg total) by mouth daily. This is in addition to 30mg  he is taking. Total dose will be 90 mg per day 30 capsule 0  . fluticasone (FLONASE) 50 MCG/ACT nasal spray     . gabapentin (NEURONTIN) 600 MG tablet Take 600 mg by mouth 2 (two) times daily. Reported on 05/10/2015    . lamoTRIgine (LAMICTAL) 100 MG tablet Take 1 tablet (100 mg total) by mouth daily. Take one a day 30 tablet 1  .  levothyroxine (SYNTHROID) 150 MCG tablet Take by mouth.    . pantoprazole (PROTONIX) 40 MG tablet Take 40 mg by mouth daily.    . vitamin B-12 (CYANOCOBALAMIN) 1000 MCG tablet Take by mouth.     No current facility-administered medications for this visit.    Previous Psychotropic Medications: Yes  See HPI. wellbutrin caused headaches zoloft and abilify didn't seem to help   Treatment Plan Summary: Medication management and Plan as follows  Major depression:  Continue cymbalta 30 plus 60 Continue lamictal for mood stabilization and depression at 100mg .  Has stopped xanax.  Generalized anxiety disorder; continue Cymbalta as above.    Mood disorder; possibly related with sleep apnea, hypothyroid, back condition contributing to the mood  symptoms and feeling frustrated at work. Also possible related with not having job satisfaction. Sleep: reviewed sleep hygiene. cpap compliance. Go to bed somewhat late  Reviewed weight maintenance and physical activity encouraged to add more physical activity More than 50% time spent in counseling and coordination of care including patient education. Wife is also hearing collaborated with information and treatment plan Call 911 or report local emergency room for any urgent concerns or suicidal thoughts Follow-up in 2 months or earlier if needed  Time spent: 25 minutes.     Ismael Treptow 5/1/20173:34 PM

## 2015-07-10 ENCOUNTER — Ambulatory Visit (HOSPITAL_COMMUNITY): Payer: Self-pay | Admitting: Psychiatry

## 2015-08-06 ENCOUNTER — Other Ambulatory Visit (HOSPITAL_COMMUNITY): Payer: Self-pay | Admitting: Psychiatry

## 2015-08-07 NOTE — Telephone Encounter (Signed)
Received medication request from [facility] for Cymbalta 60mg  and Cymbalta 30mg . Per Dr. Gilmore LarocheAkhtar, medication request is authorized for a refill for Cymbalta 60mg , #30 and Cymbalta 30mg , #30. Prescriptions were sent to pharmacy. Pt is schedule for a f/u appt on 09/06/15. Called and informed pt of rx status. Pt verbalizes understanding.

## 2015-09-04 DIAGNOSIS — M47817 Spondylosis without myelopathy or radiculopathy, lumbosacral region: Secondary | ICD-10-CM | POA: Diagnosis not present

## 2015-09-06 ENCOUNTER — Ambulatory Visit (HOSPITAL_COMMUNITY): Payer: Self-pay | Admitting: Psychiatry

## 2015-09-12 ENCOUNTER — Other Ambulatory Visit (HOSPITAL_COMMUNITY): Payer: Self-pay | Admitting: Psychiatry

## 2015-09-15 ENCOUNTER — Other Ambulatory Visit (HOSPITAL_COMMUNITY): Payer: Self-pay | Admitting: Psychiatry

## 2015-09-18 ENCOUNTER — Encounter (HOSPITAL_COMMUNITY): Payer: Self-pay | Admitting: Psychiatry

## 2015-09-18 ENCOUNTER — Ambulatory Visit (INDEPENDENT_AMBULATORY_CARE_PROVIDER_SITE_OTHER): Payer: BLUE CROSS/BLUE SHIELD | Admitting: Psychiatry

## 2015-09-18 VITALS — BP 126/70 | HR 64 | Ht 76.0 in | Wt 304.0 lb

## 2015-09-18 DIAGNOSIS — F331 Major depressive disorder, recurrent, moderate: Secondary | ICD-10-CM | POA: Diagnosis not present

## 2015-09-18 DIAGNOSIS — F411 Generalized anxiety disorder: Secondary | ICD-10-CM | POA: Diagnosis not present

## 2015-09-18 DIAGNOSIS — F0630 Mood disorder due to known physiological condition, unspecified: Secondary | ICD-10-CM | POA: Diagnosis not present

## 2015-09-18 MED ORDER — DULOXETINE HCL 60 MG PO CPEP: 60.0000 mg | ORAL_CAPSULE | Freq: Every day | ORAL | Status: DC

## 2015-09-18 MED ORDER — LAMOTRIGINE 100 MG PO TABS: 100.0000 mg | ORAL_TABLET | Freq: Every day | ORAL | Status: DC

## 2015-09-18 MED ORDER — DULOXETINE HCL 30 MG PO CPEP: ORAL_CAPSULE | ORAL | Status: DC

## 2015-09-18 NOTE — Progress Notes (Signed)
Patient ID: Mark Good, male   DOB: Jan 25, 1966, 50 y.o.   MRN: [id number]  [facility] Outpatient Follow up visit   Patient Identification: Mark Good MRN:  [id number] Date of Evaluation:  09/18/2015 Referral Source: [facility], primary care Chief Complaint:   Chief Complaint    Follow-up     Visit Diagnosis:    ICD-9-CM ICD-10-CM   1. Moderate episode of recurrent major depressive disorder (HCC) 296.32 F33.1   2. GAD (generalized anxiety disorder) 300.02 F41.1   3. Mood disorder in conditions classified elsewhere 293.83 F06.30    Diagnosis:   Patient Active Problem List   Diagnosis Date Noted  . Acid reflux [K21.9] 05/10/2015  . Adult hypothyroidism [E03.9] 05/10/2015  . Essential (primary) hypertension [I10] 02/19/2015  . Neuropathy (HCC) [G62.9] 01/10/2015  . Obstructive apnea [G47.33] 01/10/2015  . Hereditary and idiopathic peripheral neuropathy [G60.9] 12/25/2013  . Dysphagia [R13.10] 12/25/2013  . Abnormality of gait [R26.9] 12/25/2013   History of Present Illness:  50 years old currently married Caucasian male initially referred for management of depression.   Mood was somewhat better or baseline, he has some limitation because of his back condition that frustrates him at times. He is looking for something that he can function otherwise as of now keeps himself busy with yardwork No more xanax for last month. No withdrawals.  Some less tiredness noted by him. Takes neurontin at night also helps his mood.   No side effects. Sleep remains variabe Anxiety has not worsened, but remains distracted at times at work.  Aggravating factors; not satisfied with the job. Difficulty focusing at times. Mom's death. Relationship difficulties at times with finance related.  Modifying factors; totherwise supportive wife for the last 20 years. Grown kids Medical complexity: spinal fusion surgery in past. DDD. Hypothyroid and sleep apnea. Location;  Severity of depression; 6 out of 10. 10 being  no depression Context; feels stagnated at time with work.  Duration; 5-10 years Timing; variable  (Hypo) Manic Symptoms:  Distractibility, (some improvment) Anxiety Symptoms:  Excessive Worry,(some improvement) Psychotic Symptoms:  denies PTSD Symptoms: NA  Past Medical History:  Past Medical History  Diagnosis Date  . Hypertension   . Depression   . Anxiety   . Hypothyroidism   . Headache(784.0)   . Sleep apnea     uses cpap  . GERD (gastroesophageal reflux disease)     Past Surgical History  Procedure Laterality Date  . Nasal septoplasty w/ turbinoplasty    . Left leg repair of laceration    . Knee arthroscopy Right   . Cardiac catheterization      Dr Tresa Endo  . Lumbar fusion  09/23/2012    Dr Shon Baton   Family History:  Family History  Problem Relation Age of Onset  . Fibromyalgia Mother   . Other Mother     back and knee issues  . Fibromyalgia Father   . Other Brother   . Other Brother   . Other Brother    Social History:   Social History   Social History  . Marital Status: Married    Spouse Name: Lamar Laundry  . Number of Children: 2  . 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 Smoking status: Never Smoker   . Smokeless tobacco: Never Used  . Alcohol Use: Yes     Comment: drink once a month  . Drug Use: No  .  Sexual Activity: Not Asked   Other Topics Concern  . None   Social History Narrative   Patient lives at home with spouse.   Caffeine Use: 3 cups daily     Musculoskeletal: Strength & Muscle Tone: within normal limits Gait & Station: normal Patient leans: N/A  Psychiatric Specialty Exam: HPI  Review of Systems  Constitutional: Positive for malaise/fatigue. Negative for fever.  Cardiovascular: Negative for chest pain.  Skin: Negative for rash.  Neurological: Negative for tremors.  Psychiatric/Behavioral: Negative for suicidal ideas and substance abuse.     Blood pressure 126/70, pulse 64, height 6\' 4"  (1.93 m), weight 304 lb (137.893 kg), SpO2 94 %.Body mass index is 37.02 kg/(m^2).  General Appearance: Casual  Eye Contact:  Fair  Speech:  Normal Rate  Volume:  Normal  Mood:  Fair not hopeless  Affect:  Congruent and Constricted  Thought Process:  Coherent  Orientation:  Full (Time, Place, and Person)  Thought Content:  Rumination  Suicidal Thoughts:  No  Homicidal Thoughts:  No  Memory:  Immediate;   Fair Recent;   Fair  Judgement:  Fair  Insight:  Shallow  Psychomotor Activity:  Decreased  Concentration:  Fair  Recall:  [employer]is[employer]Fair  Fund of Knowledge:Fair  Language: Fair  Akathisia:  Negative  Handed:  Right  AIMS (if indicated):    Assets:  Desire for Improvement Social Support  ADL's:  Intact  Cognition: WNL  Sleep:  variable   Is the patient at risk to self?  No. Has the patient been a risk to self in the past 6 months?  No.  Allergies:   Allergies  Allergen Reactions  . Bupropion Other (See Comments)    Headaches   Current Medications: Current Outpatient Prescriptions  Medication Sig Dispense Refill  . Aspirin-Acetaminophen-Caffeine (GOODYS EXTRA STRENGTH PO) Take 4 packets by mouth daily.    Marland Kitchen. atenolol-chlorthalidone (TENORETIC) 50-25 MG per tablet Take 1 tablet by mouth daily.    . Cholecalciferol (VITAMIN D3) 2000 UNITS TABS Take 1 tablet by mouth daily.    . DULoxetine (CYMBALTA) 30 MG capsule TAKE 1 CAPSULE BY MOUTH DAILY. THIS IS IN ADDITION TO 60MG  30 capsule 1  . DULoxetine (CYMBALTA) 60 MG capsule Take 1 capsule (60 mg total) by mouth daily. 30 capsule 1  . fluticasone (FLONASE) 50 MCG/ACT nasal spray     . gabapentin (NEURONTIN) 600 MG tablet Take 600 mg by mouth 2 (two) times daily. Reported on 05/10/2015    . lamoTRIgine (LAMICTAL) 100 MG tablet Take 1 tablet (100 mg total) by mouth daily. Take one a day 30 tablet 1  . levothyroxine (SYNTHROID) 150 MCG tablet Take by mouth.    . pantoprazole (PROTONIX)  40 MG tablet Take 40 mg by mouth daily.    . vitamin B-12 (CYANOCOBALAMIN) 1000 MCG tablet Take by mouth.     No current facility-administered medications for this visit.    Previous Psychotropic Medications: Yes  See HPI. wellbutrin caused headaches zoloft and abilify didn't seem to help   Treatment Plan Summary: Medication management and Plan as follows  Major depression:  Continue cymbalta 30 plus 60 Continue lamictal for mood stabilization and depression at 100mg .  Has stopped xanax.  Generalized anxiety disorder; continue Cymbalta as above. Also takes neurontin He may look for some other job is getting some motivation but difficult to change.  Mood disorder; possibly related with sleep apnea, hypothyroid, back condition contributing to the mood symptoms and feeling frustrated at work. Also  possible related with not having job satisfaction. Sleep: reviewed sleep hygiene. cpap compliance. Go to bed somewhat late  Reviewed weight maintenance and physical activity encouraged to add more physical activity More than 50% time spent in counseling and coordination of care including patient education. Wife is also hearing collaborated with information and treatment plan Call 911 or report local emergency room for any urgent concerns or suicidal thoughts Follow-up in 2 to 3  months or earlier if needed  Time spent: 25 minutes.     Emari Demmer 7/11/20174:19 PM

## 2015-09-24 DIAGNOSIS — M5387 Other specified dorsopathies, lumbosacral region: Secondary | ICD-10-CM | POA: Diagnosis not present

## 2015-09-24 DIAGNOSIS — M533 Sacrococcygeal disorders, not elsewhere classified: Secondary | ICD-10-CM | POA: Diagnosis not present

## 2015-09-24 DIAGNOSIS — Z79891 Long term (current) use of opiate analgesic: Secondary | ICD-10-CM | POA: Diagnosis not present

## 2015-09-24 DIAGNOSIS — G8929 Other chronic pain: Secondary | ICD-10-CM | POA: Diagnosis not present

## 2015-11-14 ENCOUNTER — Other Ambulatory Visit (HOSPITAL_COMMUNITY): Payer: Self-pay | Admitting: Psychiatry

## 2015-11-19 NOTE — Telephone Encounter (Signed)
Received fax from [facility] requesting refills for Cymbalta and Lamictal. Per Dr. Gilmore LarocheAkhtar, refills are authorized for Cymbalta 30mg , #30, Cymbalta 60mg , #30 and Lamictal 100mg , #30. Rx's were sent electronically to pharmacy. Pt is schedule for a f/u appt on 12/13/15. Called and informed pt of refill status. Pt verbalizes understanding.

## 2015-12-13 ENCOUNTER — Ambulatory Visit (INDEPENDENT_AMBULATORY_CARE_PROVIDER_SITE_OTHER): Payer: BLUE CROSS/BLUE SHIELD | Admitting: Psychiatry

## 2015-12-13 ENCOUNTER — Encounter (HOSPITAL_COMMUNITY): Payer: Self-pay | Admitting: Psychiatry

## 2015-12-13 VITALS — BP 116/64 | HR 60 | Resp 16 | Ht 76.0 in | Wt 307.0 lb

## 2015-12-13 DIAGNOSIS — F411 Generalized anxiety disorder: Secondary | ICD-10-CM | POA: Diagnosis not present

## 2015-12-13 DIAGNOSIS — F0630 Mood disorder due to known physiological condition, unspecified: Secondary | ICD-10-CM

## 2015-12-13 DIAGNOSIS — F331 Major depressive disorder, recurrent, moderate: Secondary | ICD-10-CM | POA: Diagnosis not present

## 2015-12-13 MED ORDER — DULOXETINE HCL 30 MG PO CPEP: ORAL_CAPSULE | ORAL | 2 refills | Status: DC

## 2015-12-13 MED ORDER — DULOXETINE HCL 60 MG PO CPEP: 60.0000 mg | ORAL_CAPSULE | Freq: Every day | ORAL | 2 refills | Status: DC

## 2015-12-13 MED ORDER — LAMOTRIGINE 100 MG PO TABS: 100.0000 mg | ORAL_TABLET | Freq: Every day | ORAL | 2 refills | Status: DC

## 2015-12-13 NOTE — Progress Notes (Signed)
Patient ID: Mark Good, male   DOB: 28-May-1965, 50 y.o.   MRN: [id number]  [facility] Outpatient Follow up visit   Patient Identification: Mark Good MRN:  [id number] Date of Evaluation:  12/13/2015 Referral Source: [facility], primary care Chief Complaint:   Chief Complaint    Follow-up     Visit Diagnosis:    ICD-9-CM ICD-10-CM   1. Moderate episode of recurrent major depressive disorder (HCC) 296.32 F33.1   2. GAD (generalized anxiety disorder) 300.02 F41.1   3. Mood disorder in conditions classified elsewhere 293.83 F06.30    Diagnosis:   Patient Active Problem List   Diagnosis Date Noted  . Acid reflux [K21.9] 05/10/2015  . Adult hypothyroidism [E03.9] 05/10/2015  . Essential (primary) hypertension [I10] 02/19/2015  . Neuropathy (HCC) [G62.9] 01/10/2015  . Obstructive apnea [G47.33] 01/10/2015  . Hereditary and idiopathic peripheral neuropathy [G60.9] 12/25/2013  . Dysphagia [R13.10] 12/25/2013  . Abnormality of gait [R26.9] 12/25/2013   History of Present Illness:  50 years old currently married Caucasian male initially referred for management of depression.  Mood better and energy. Some concern at job .feels wants to have a change.  No more xanax for last month. No withdrawals.  Some less tiredness noted by him. Takes neurontin at night also helps his mood.   No side effects. Sleep remains variabe Anxiety has not worsened, Aggravating factors; not satisfied with the job. Difficulty focusing at times. Mom's death. Relationship difficulties at times with finance related.  Modifying factors; totherwise supportive wife for the last 20 years. Grown kids Medical complexity: spinal fusion surgery in past. DDD. Hypothyroid and sleep apnea. Location;  Severity of depression; 7 out of 10. 10 being no depression Context; feels stagnated at time with work.  Duration; 5-10 years Timing; variable  (Hypo) Manic Symptoms:  Distractibility, (some improvment) Anxiety Symptoms:  Not  worsened  Psychotic Symptoms:  denies PTSD Symptoms: NA  Past Medical History:  Past Medical History:  Diagnosis Date  . Anxiety   . Depression   . GERD (gastroesophageal reflux disease)   . Headache(784.0)   . Hypertension   . Hypothyroidism   . Sleep apnea    uses cpap    Past Surgical History:  Procedure Laterality Date  . CARDIAC CATHETERIZATION     Dr Tresa Endo  . KNEE ARTHROSCOPY Right   . left leg repair of laceration    . LUMBAR FUSION  09/23/2012   Dr Shon Baton  . NASAL SEPTOPLASTY W/ TURBINOPLASTY     Family History:  Family History  Problem Relation Age of Onset  . Fibromyalgia Mother   . Other Mother     back and knee issues  . Fibromyalgia Father   . Other Brother   . Other Brother   . Other Brother    Social History:   Social History   Social History  . Marital status: Married    Spouse name: Lamar Laundry  . Number of children: 2  . 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 Smoking status: Never Smoker  . Smokeless tobacco: Never Used  . Alcohol use Yes     Comment: drink once a month  . Drug use: No  . Sexual activity: Not Asked   Other Topics Concern  . None   Social History Narrative   Patient lives at home with spouse.   Caffeine Use: 3 cups daily  Musculoskeletal: Strength & Muscle Tone: within normal limits Gait & Station: normal Patient leans: N/A  Psychiatric Specialty Exam: HPI  Review of Systems  Constitutional: Negative for fever.  Cardiovascular: Negative for chest pain.  Skin: Negative for rash.  Neurological: Negative for tremors.  Psychiatric/Behavioral: Negative for depression, substance abuse and suicidal ideas.    Blood pressure 116/64, pulse 60, resp. rate 16, height 6\' 4"  (1.93 m), weight (!) 307 lb (139.3 kg), SpO2 97 %.Body mass index is 37.37 kg/m.  General Appearance: Casual  Eye Contact:  Fair  Speech:  Normal Rate   Volume:  Normal  Mood:  Fair not hopeless  Affect:  Congruent and less constricted  Thought Process:  Coherent  Orientation:  Full (Time, Place, and Person)  Thought Content:  Rumination  Suicidal Thoughts:  No  Homicidal Thoughts:  No  Memory:  Immediate;   Fair Recent;   Fair  Judgement:  Fair  Insight:  Shallow  Psychomotor Activity:  Decreased  Concentration:  Fair  Recall:  [employer] of Knowledge:Fair  Language: Fair  Akathisia:  Negative  Handed:  Right  AIMS (if indicated):    Assets:  Desire for Improvement Social Support  ADL's:  Intact  Cognition: WNL  Sleep:  variable   Is the patient at risk to self?  No. Has the patient been a risk to self in the past 6 months?  No.  Allergies:   Allergies  Allergen Reactions  . Bupropion Other (See Comments)    Headaches   Current Medications: Current Outpatient Prescriptions  Medication Sig Dispense Refill  . Aspirin-Acetaminophen-Caffeine (GOODYS EXTRA STRENGTH PO) Take 4 packets by mouth daily.    Marland Kitchen atenolol-chlorthalidone (TENORETIC) 50-25 MG per tablet Take 1 tablet by mouth daily.    . Cholecalciferol (VITAMIN D3) 2000 UNITS TABS Take 1 tablet by mouth daily.    . DULoxetine (CYMBALTA) 30 MG capsule TAKE 1 CAPSULE BY MOUTH DAILY. THIS IS IN ADDITION TO 60MG  30 capsule 2  . DULoxetine (CYMBALTA) 60 MG capsule Take 1 capsule (60 mg total) by mouth daily. 30 capsule 2  . fluticasone (FLONASE) 50 MCG/ACT nasal spray     . gabapentin (NEURONTIN) 600 MG tablet Take 600 mg by mouth 2 (two) times daily. Reported on 05/10/2015    . lamoTRIgine (LAMICTAL) 100 MG tablet Take 1 tablet (100 mg total) by mouth daily. Take one a day 30 tablet 2  . levothyroxine (SYNTHROID) 150 MCG tablet Take by mouth.    . pantoprazole (PROTONIX) 40 MG tablet Take 40 mg by mouth daily.    . vitamin B-12 (CYANOCOBALAMIN) 1000 MCG tablet Take by mouth.     No current facility-administered medications for this visit.     Previous Psychotropic  Medications: Yes  See HPI. wellbutrin caused headaches zoloft and abilify didn't seem to help   Treatment Plan Summary: Medication management and Plan as follows  Major depression:  Continue cymbalta 30 plus 60 Continue lamictal for mood stabilization and depression at 100mg .  Not on xanax.  Generalized anxiety disorder; continue Cymbalta as above. Also takes neurontin He may look for some other job is getting some motivation but difficult to change.  Mood disorder; possibly related with sleep apnea, hypothyroid, back condition contributing to the mood symptoms and feeling frustrated at work.     More than 50% time spent in counseling and coordination of care including patient education. Wife is also hearing collaborated with information and treatment plan Call 911 or report local  emergency room for any urgent concerns or suicidal thoughts Follow-up in 2 to 3  months or earlier if needed  Time spent: 25 minutes.     Brace Welte 10/5/20174:09 PM

## 2015-12-17 ENCOUNTER — Other Ambulatory Visit (HOSPITAL_COMMUNITY): Payer: Self-pay | Admitting: Psychiatry

## 2015-12-18 NOTE — Telephone Encounter (Signed)
Medication refill- received fax from [facility] requesting refills for Cymbalta and Lamictal. Per Dr. Gilmore LarocheAkhtar, refill request is denied. Prescriptions were sent to pharmacy on 10/5. Pt is schedule for f/u appt on 03/12/16.

## 2016-02-08 DIAGNOSIS — G4733 Obstructive sleep apnea (adult) (pediatric): Secondary | ICD-10-CM | POA: Diagnosis not present

## 2016-03-05 ENCOUNTER — Other Ambulatory Visit (HOSPITAL_COMMUNITY): Payer: Self-pay | Admitting: Psychiatry

## 2016-03-12 ENCOUNTER — Encounter (HOSPITAL_COMMUNITY): Payer: Self-pay | Admitting: Psychiatry

## 2016-03-12 ENCOUNTER — Ambulatory Visit (INDEPENDENT_AMBULATORY_CARE_PROVIDER_SITE_OTHER): Payer: BLUE CROSS/BLUE SHIELD | Admitting: Psychiatry

## 2016-03-12 VITALS — BP 126/74 | HR 65 | Resp 16 | Ht 76.0 in | Wt 316.0 lb

## 2016-03-12 DIAGNOSIS — F331 Major depressive disorder, recurrent, moderate: Secondary | ICD-10-CM

## 2016-03-12 DIAGNOSIS — Z8489 Family history of other specified conditions: Secondary | ICD-10-CM

## 2016-03-12 DIAGNOSIS — F411 Generalized anxiety disorder: Secondary | ICD-10-CM | POA: Diagnosis not present

## 2016-03-12 DIAGNOSIS — Z9889 Other specified postprocedural states: Secondary | ICD-10-CM | POA: Diagnosis not present

## 2016-03-12 DIAGNOSIS — F0630 Mood disorder due to known physiological condition, unspecified: Secondary | ICD-10-CM

## 2016-03-12 MED ORDER — LAMOTRIGINE 100 MG PO TABS: 100.0000 mg | ORAL_TABLET | Freq: Every day | ORAL | 2 refills | Status: DC

## 2016-03-12 MED ORDER — DULOXETINE HCL 60 MG PO CPEP: 60.0000 mg | ORAL_CAPSULE | Freq: Every day | ORAL | 2 refills | Status: DC

## 2016-03-12 MED ORDER — DULOXETINE HCL 30 MG PO CPEP: ORAL_CAPSULE | ORAL | 2 refills | Status: DC

## 2016-03-12 NOTE — Progress Notes (Signed)
Patient ID: Mark ColaWilliam L Shook, male   DOB: 1965-08-24, 51 y.o.   MRN: [id number]  [facility]   Patient Identification: Mark Good MRN:  [id number] Date of Evaluation:  03/12/2016 Referral Source: [facility], primary care Chief Complaint:   Chief Complaint    Follow-up     Visit Diagnosis:    ICD-9-CM ICD-10-CM   1. Moderate episode of recurrent major depressive disorder (HCC) 296.32 F33.1   2. GAD (generalized anxiety disorder) 300.02 F41.1   3. Mood disorder in conditions classified elsewhere 293.83 F06.30    Diagnosis:   Patient Active Problem List   Diagnosis Date Noted  . Acid reflux [K21.9] 05/10/2015  . Adult hypothyroidism [E03.9] 05/10/2015  . Essential (primary) hypertension [I10] 02/19/2015  . Neuropathy (HCC) [G62.9] 01/10/2015  . Obstructive apnea [G47.33] 01/10/2015  . Hereditary and idiopathic peripheral neuropathy [G60.9] 12/25/2013  . Dysphagia [R13.10] 12/25/2013  . Abnormality of gait [R26.9] 12/25/2013   History of Present Illness:  51 years old currently married Caucasian male initially referred for management of depression.  Patient is doing better regarding mood and energy they have moved to another place so he is having more space and he likes it otherwise job as a manic concern he becomes bored and monotonous otherwise it does help him financially. No withdrawal since he has been off from Xanax  Anxiety is manageable  Aggravating factors; not satisfied with the job. Difficulty focusing at times. Mom's death. Relationship difficulties at times with finance related.  Modifying factors; totherwise supportive wife for the last 20 years. Grown kids Medical complexity: spinal fusion surgery in past. DDD. Hypothyroid and sleep apnea. Location;  Severity of depression; 7 out of 10. 10 being no depression Context; feels stagnated at time with work.  Duration; 5-10 years Timing; variable    Past Medical History:  Past Medical History:   Diagnosis Date  . Anxiety   . Depression   . GERD (gastroesophageal reflux disease)   . Headache(784.0)   . Hypertension   . Hypothyroidism   . Sleep apnea    uses cpap    Past Surgical History:  Procedure Laterality Date  . CARDIAC CATHETERIZATION     Dr Tresa EndoKelly  . KNEE ARTHROSCOPY Right   . left leg repair of laceration    . LUMBAR FUSION  09/23/2012   Dr Shon BatonBrooks  . NASAL SEPTOPLASTY W/ TURBINOPLASTY     Family History:  Family History  Problem Relation Age of Onset  . Fibromyalgia Mother   . Other Mother     back and knee issues  . Fibromyalgia Father   . Other Brother   . Other Brother   . Other Brother    Social History:   Social History   Social History  . Marital status: Married    Spouse name: Lamar LaundrySonya  . Number of children: 2  . 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 Smoking status: Never Smoker  . Smokeless tobacco: Never Used  . Alcohol use Yes     Comment: drink once a month  . Drug use: No  . Sexual activity: Yes    Partners: Female   Other Topics Concern  . None   Social History Narrative   Patient lives at home with spouse.   Caffeine Use: 3 cups daily       Psychiatric Specialty Exam: HPI  Review of Systems  Constitutional: Negative for fever.  Cardiovascular: Negative for palpitations.  Gastrointestinal: Negative for nausea.  Skin: Negative for rash.  Neurological: Negative for tremors.  Psychiatric/Behavioral: Negative for depression, substance abuse and suicidal ideas.    Blood pressure 126/74, pulse 65, resp. rate 16, height 6\' 4"  (1.93 m), weight (!) 316 lb (143.3 kg), SpO2 99 %.Body mass index is 38.46 kg/m.  General Appearance: Casual  Eye Contact:  Fair  Speech:  Normal Rate  Volume:  Normal  Mood:  euthymic  Affect:  Congruent and less constricted  Thought Process:  Coherent  Orientation:  Full (Time, Place, and Person)   Thought Content:  Rumination  Suicidal Thoughts:  No  Homicidal Thoughts:  No  Memory:  Immediate;   Fair Recent;   Fair  Judgement:  Fair  Insight:  Shallow  Psychomotor Activity:  Decreased  Concentration:  Fair  Recall:  [employer] of Knowledge:Fair  Language: Fair  Akathisia:  Negative  Handed:  Right  AIMS (if indicated):    Assets:  Desire for Improvement Social Support  ADL's:  Intact  Cognition: WNL  Sleep:  variable   Is the patient at risk to self?  No. Has the patient been a risk to self in the past 6 months?  No.  Allergies:   Allergies  Allergen Reactions  . Bupropion Other (See Comments)    Headaches   Current Medications: Current Outpatient Prescriptions  Medication Sig Dispense Refill  . Aspirin-Acetaminophen-Caffeine (GOODYS EXTRA STRENGTH PO) Take 4 packets by mouth daily.    Marland Kitchen atenolol-chlorthalidone (TENORETIC) 50-25 MG per tablet Take 1 tablet by mouth daily.    . Cholecalciferol (VITAMIN D3) 2000 UNITS TABS Take 1 tablet by mouth daily.    . DULoxetine (CYMBALTA) 30 MG capsule TAKE 1 CAPSULE BY MOUTH DAILY. THIS IS IN ADDITION TO 60MG  30 capsule 2  . DULoxetine (CYMBALTA) 60 MG capsule Take 1 capsule (60 mg total) by mouth daily. 30 capsule 2  . fluticasone (FLONASE) 50 MCG/ACT nasal spray     . gabapentin (NEURONTIN) 600 MG tablet Take 600 mg by mouth 2 (two) times daily. Reported on 05/10/2015    . lamoTRIgine (LAMICTAL) 100 MG tablet Take 1 tablet (100 mg total) by mouth daily. Take one a day 30 tablet 2  . levothyroxine (SYNTHROID) 150 MCG tablet Take by mouth.    . pantoprazole (PROTONIX) 40 MG tablet Take 40 mg by mouth daily.    . vitamin B-12 (CYANOCOBALAMIN) 1000 MCG tablet Take by mouth.     No current facility-administered medications for this visit.     Previous Psychotropic Medications: Yes  See HPI. wellbutrin caused headaches zoloft and abilify didn't seem to help   Treatment Plan Summary: Medication management and Plan as  follows   Mdd: continue cymbalta 60 plus 30mg  GAD: cymbalta as above.  Also on lamictal for mood symptoms. No rash  Not on xanax.  Generalized anxiety disorder; continue Cymbalta as above. Also takes neurontin He may look for some other job is getting some motivation but difficult to change.  Mood disorder; possibly related with sleep apnea, hypothyroid, back condition contributing to the mood symptoms and feeling frustrated at work.  Continue lamictal. It helps FU 3 months. Prescriptions written.     Lottie Sigman 1/3/20181:41 PM

## 2016-04-24 DIAGNOSIS — G4733 Obstructive sleep apnea (adult) (pediatric): Secondary | ICD-10-CM | POA: Diagnosis not present

## 2016-05-13 ENCOUNTER — Telehealth (HOSPITAL_COMMUNITY): Payer: Self-pay | Admitting: Psychiatry

## 2016-05-13 NOTE — Telephone Encounter (Signed)
Pt has been taking at 30 mg of cymbalta and 60 mg. Pt insurance has changed and would like the rx changed to 30 mg 3 times a day.   Please advise

## 2016-05-14 NOTE — Telephone Encounter (Signed)
Please see previous note.

## 2016-05-15 MED ORDER — DULOXETINE HCL 30 MG PO CPEP: ORAL_CAPSULE | ORAL | 0 refills | Status: DC

## 2016-05-15 NOTE — Telephone Encounter (Signed)
Per Dr. Gilmore LarocheAkhtar, please new prescription for Cymbalta 30mg , #90. Prescription was sent to pharmacy. Called and informed pt of prescription change. Pt's next apt is schedule on 06/17/16. Pt verbalizes understanding.

## 2016-05-15 NOTE — Telephone Encounter (Signed)
Please change to 30mg  tid as per request.

## 2016-06-10 ENCOUNTER — Other Ambulatory Visit (HOSPITAL_COMMUNITY): Payer: Self-pay | Admitting: *Deleted

## 2016-06-10 ENCOUNTER — Other Ambulatory Visit (HOSPITAL_COMMUNITY): Payer: Self-pay | Admitting: Psychiatry

## 2016-06-10 MED ORDER — DULOXETINE HCL 30 MG PO CPEP: ORAL_CAPSULE | ORAL | 0 refills | Status: DC

## 2016-06-10 MED ORDER — LAMOTRIGINE 100 MG PO TABS: 100.0000 mg | ORAL_TABLET | Freq: Every day | ORAL | 0 refills | Status: DC

## 2016-06-10 NOTE — Telephone Encounter (Signed)
Medication refill- pt lvm requesting a refill for Cymbalta and Lamictal.  Pt states insurance will not cover the cost of medication if refill is not a 90 day supply.  Per Dr. Gilmore Laroche, refill request is authorize for Cymbalta , #270 and Lamictal , #90. Prescription was sent to pharmacy. Pt's next apt is schedule on 06/17/16. Called and informed pt of refill status. Pt verbalizes understanding.

## 2016-06-10 NOTE — Telephone Encounter (Signed)
Medication refill- pt lvm requesting a refill for Cymbalta and Lamictal.  Pt states insurance will not cover the cost of medication if refill is not a 90 day supply.  Per Dr. Gilmore Laroche, refill request is authorize for Cymbalta , #270 and Lamictal , #90. Prescription was sent to [facility] pharmacy. Pt's next apt is schedule on 06/17/16. Called and informed pt of refill status. Pt verbalizes understanding.

## 2016-06-17 ENCOUNTER — Ambulatory Visit (HOSPITAL_COMMUNITY): Payer: Self-pay | Admitting: Psychiatry

## 2016-08-07 ENCOUNTER — Encounter (HOSPITAL_COMMUNITY): Payer: Self-pay | Admitting: Psychiatry

## 2016-08-07 ENCOUNTER — Ambulatory Visit (INDEPENDENT_AMBULATORY_CARE_PROVIDER_SITE_OTHER): Payer: BLUE CROSS/BLUE SHIELD | Admitting: Psychiatry

## 2016-08-07 VITALS — BP 126/70 | HR 66 | Resp 16 | Ht 76.0 in | Wt 304.0 lb

## 2016-08-07 DIAGNOSIS — F0630 Mood disorder due to known physiological condition, unspecified: Secondary | ICD-10-CM | POA: Diagnosis not present

## 2016-08-07 DIAGNOSIS — F331 Major depressive disorder, recurrent, moderate: Secondary | ICD-10-CM

## 2016-08-07 DIAGNOSIS — F411 Generalized anxiety disorder: Secondary | ICD-10-CM | POA: Diagnosis not present

## 2016-08-07 MED ORDER — DULOXETINE HCL 30 MG PO CPEP: ORAL_CAPSULE | ORAL | 0 refills | Status: DC

## 2016-08-07 NOTE — Progress Notes (Signed)
Patient ID: Mark Good, male   DOB: 14-Apr-1965, 51 y.o.   MRN: [id number]  [facility] Outpatient Follow up visit   Patient Identification: Mark Good MRN:  [id number] Date of Evaluation:  08/07/2016 Referral Source: [facility], primary care Chief Complaint:   Chief Complaint    Follow-up     Visit Diagnosis:    ICD-9-CM ICD-10-CM   1. Moderate episode of recurrent major depressive disorder (HCC) 296.32 F33.1   2. GAD (generalized anxiety disorder) 300.02 F41.1   3. Mood disorder in conditions classified elsewhere 293.83 F06.30    Diagnosis:   Patient Active Problem List   Diagnosis Date Noted  . Acid reflux [K21.9] 05/10/2015  . Adult hypothyroidism [E03.9] 05/10/2015  . Essential (primary) hypertension [I10] 02/19/2015  . Neuropathy [G62.9] 01/10/2015  . Obstructive apnea [G47.33] 01/10/2015  . Hereditary and idiopathic peripheral neuropathy [G60.9] 12/25/2013  . Dysphagia [R13.10] 12/25/2013  . Abnormality of gait [R26.9] 12/25/2013   History of Present Illness:  51 years old currently married Caucasian male initially referred for management of depression.  Patient still does not like his job but he has found more purpose in life he is now spending time is for atrial and growing plants and vegetables he showed me pictures and that connects him and keeps him more positive energy and report and is improving He has stopped Lamictal more than a month now and feels less irritable says that he feels the Lamictal was probably causing some irritability he is on gabapentin for pain that may be helping as a mood stabilizer as well  He is not on Xanax anymore there is no withdrawal symptoms he feels his mood and energy is better   Aggravating factors; job stress Modifying factors;wife, farm, outdoor work  Medical complexity: spinal fusion  Severity of depression; 8/10. 10 being no depression     Past Medical History:  Past Medical History:  Diagnosis Date  . Anxiety   .  Depression   . GERD (gastroesophageal reflux disease)   . Headache(784.0)   . Hypertension   . Hypothyroidism   . Sleep apnea    uses cpap    Past Surgical History:  Procedure Laterality Date  . CARDIAC CATHETERIZATION     Dr Tresa Endo  . KNEE ARTHROSCOPY Right   . left leg repair of laceration    . LUMBAR FUSION  09/23/2012   Dr Shon Baton  . NASAL SEPTOPLASTY W/ TURBINOPLASTY     Family History:  Family History  Problem Relation Age of Onset  . Fibromyalgia Mother   . Other Mother        back and knee issues  . Fibromyalgia Father   . Other Brother   . Other Brother   . Other Brother    Social History:   Social History   Social History  . Marital status: Married    Spouse name: Lamar Laundry  . Number of children: 2  . 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 Smoking status: Never Smoker  . Smokeless tobacco: Never Used  . Alcohol use Yes     Comment: drink once a month  . Drug use: No  . Sexual activity: Yes    Partners: Female   Other Topics Concern  . None   Social History Narrative   Patient lives at home with spouse.   Caffeine Use: 3 cups daily  Psychiatric Specialty Exam: HPI  Review of Systems  Constitutional: Negative for fever.  Cardiovascular: Negative for chest pain.  Gastrointestinal: Negative for nausea.  Skin: Negative for rash.  Neurological: Negative for tremors.  Psychiatric/Behavioral: Negative for depression, substance abuse and suicidal ideas.    Blood pressure 126/70, pulse 66, resp. rate 16, height 6\' 4"  (1.93 m), weight (!) 304 lb (137.9 kg), SpO2 95 %.Body mass index is 37 kg/m.  General Appearance: Casual  Eye Contact:  Fair  Speech:  Normal Rate  Volume:  Normal  Mood: fair, improved  Affect:  reactive  Thought Process:  Coherent  Orientation:  Full (Time, Place, and Person)  Thought Content:  Rumination  Suicidal Thoughts:  No   Homicidal Thoughts:  No  Memory:  Immediate;   Fair Recent;   Fair  Judgement:  Fair  Insight:  Shallow  Psychomotor Activity:  Decreased  Concentration:  Fair  Recall:  [employer]Fair  Fund of Knowledge:Fair  Language: Fair  Akathisia:  Negative  Handed:  Right  AIMS (if indicated):    Assets:  Desire for Improvement Social Support  ADL's:  Intact  Cognition: WNL  Sleep:  variable    Allergies:   Allergies  Allergen Reactions  . Bupropion Other (See Comments)    Headaches   Current Medications: Current Outpatient Prescriptions  Medication Sig Dispense Refill  . Aspirin-Acetaminophen-Caffeine (GOODYS EXTRA STRENGTH PO) Take 4 packets by mouth daily.    Marland Kitchen. atenolol-chlorthalidone (TENORETIC) 50-25 MG per tablet Take 1 tablet by mouth daily.    . Cholecalciferol (VITAMIN D3) 2000 UNITS TABS Take 1 tablet by mouth daily.    . DULoxetine (CYMBALTA) 30 MG capsule Take 3 times daily. Please discontinue all previous prescriptions. 270 capsule 0  . fluticasone (FLONASE) 50 MCG/ACT nasal spray     . gabapentin (NEURONTIN) 600 MG tablet Take 600 mg by mouth 2 (two) times daily. Reported on 05/10/2015    . lamoTRIgine (LAMICTAL) 100 MG tablet Take 1 tablet (100 mg total) by mouth daily. 90 tablet 0  . levothyroxine (SYNTHROID) 150 MCG tablet Take by mouth.    . pantoprazole (PROTONIX) 40 MG tablet Take 40 mg by mouth daily.    . vitamin B-12 (CYANOCOBALAMIN) 1000 MCG tablet Take by mouth.     No current facility-administered medications for this visit.       Treatment Plan Summary: Medication management and Plan as follows   Mdd: improved. Continue cymbalta 30mg  tid No on lamictal now.  Is on gabapentin  GAD:not worse. Continue cymbalta  Mood disorder: multi factors; improved. Continue cymbalta. Outdoor work Positive support system  Follow-up in 4 months. Cymbalta questions were addressed and case is feeling more irritable or mood symptoms get worse he needs to give us a call  earlier if he understands   Cha Gomillion 5/31/201810:07 AM

## 2016-12-03 DIAGNOSIS — E7800 Pure hypercholesterolemia, unspecified: Secondary | ICD-10-CM | POA: Diagnosis not present

## 2016-12-03 DIAGNOSIS — Z125 Encounter for screening for malignant neoplasm of prostate: Secondary | ICD-10-CM | POA: Diagnosis not present

## 2016-12-03 DIAGNOSIS — E039 Hypothyroidism, unspecified: Secondary | ICD-10-CM | POA: Diagnosis not present

## 2016-12-03 DIAGNOSIS — F330 Major depressive disorder, recurrent, mild: Secondary | ICD-10-CM | POA: Diagnosis not present

## 2016-12-03 DIAGNOSIS — R7303 Prediabetes: Secondary | ICD-10-CM | POA: Diagnosis not present

## 2016-12-03 DIAGNOSIS — M4727 Other spondylosis with radiculopathy, lumbosacral region: Secondary | ICD-10-CM | POA: Diagnosis not present

## 2016-12-03 DIAGNOSIS — I10 Essential (primary) hypertension: Secondary | ICD-10-CM | POA: Diagnosis not present

## 2016-12-03 DIAGNOSIS — Z23 Encounter for immunization: Secondary | ICD-10-CM | POA: Diagnosis not present

## 2016-12-03 DIAGNOSIS — Z0000 Encounter for general adult medical examination without abnormal findings: Secondary | ICD-10-CM | POA: Diagnosis not present

## 2016-12-03 DIAGNOSIS — G4730 Sleep apnea, unspecified: Secondary | ICD-10-CM | POA: Diagnosis not present

## 2016-12-03 DIAGNOSIS — F419 Anxiety disorder, unspecified: Secondary | ICD-10-CM | POA: Diagnosis not present

## 2016-12-09 ENCOUNTER — Ambulatory Visit (INDEPENDENT_AMBULATORY_CARE_PROVIDER_SITE_OTHER): Payer: BLUE CROSS/BLUE SHIELD | Admitting: Psychiatry

## 2016-12-09 ENCOUNTER — Encounter (HOSPITAL_COMMUNITY): Payer: Self-pay | Admitting: Psychiatry

## 2016-12-09 VITALS — BP 128/76 | HR 67 | Resp 18 | Ht 76.0 in | Wt 313.0 lb

## 2016-12-09 DIAGNOSIS — F0630 Mood disorder due to known physiological condition, unspecified: Secondary | ICD-10-CM | POA: Diagnosis not present

## 2016-12-09 DIAGNOSIS — Z79899 Other long term (current) drug therapy: Secondary | ICD-10-CM | POA: Diagnosis not present

## 2016-12-09 DIAGNOSIS — F411 Generalized anxiety disorder: Secondary | ICD-10-CM | POA: Diagnosis not present

## 2016-12-09 DIAGNOSIS — F331 Major depressive disorder, recurrent, moderate: Secondary | ICD-10-CM | POA: Diagnosis not present

## 2016-12-09 MED ORDER — DULOXETINE HCL 30 MG PO CPEP: ORAL_CAPSULE | ORAL | 0 refills | Status: DC

## 2016-12-09 MED ORDER — LAMOTRIGINE 100 MG PO TABS: 100.0000 mg | ORAL_TABLET | Freq: Every day | ORAL | 0 refills | Status: DC

## 2016-12-09 NOTE — Progress Notes (Signed)
Patient ID: WINSLOW EDERER, male   DOB: 1965/09/08, 51 y.o.   MRN: [id number]  [facility] Outpatient Follow up visit   Patient Identification: Mark Good MRN:  [id number] Date of Evaluation:  12/09/2016 Referral Source: [facility], primary care Chief Complaint:   Chief Complaint    Follow-up     Visit Diagnosis:    ICD-10-CM   1. Moderate episode of recurrent major depressive disorder (HCC) F33.1   2. Mood disorder in conditions classified elsewhere F06.30   3. GAD (generalized anxiety disorder) F41.1    Diagnosis:   Patient Active Problem List   Diagnosis Date Noted  . Acid reflux [K21.9] 05/10/2015  . Adult hypothyroidism [E03.9] 05/10/2015  . Essential (primary) hypertension [I10] 02/19/2015  . Neuropathy [G62.9] 01/10/2015  . Obstructive apnea [G47.33] 01/10/2015  . Hereditary and idiopathic peripheral neuropathy [G60.9] 12/25/2013  . Dysphagia [R13.10] 12/25/2013  . Abnormality of gait [R26.9] 12/25/2013   History of Present Illness:  51 years old currently married Caucasian male initially referred for management of depression.  He has grown chickens 50 now living in the form does outdoor activities that keeps him busy and his wife. 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 Anxiety   . Depression   . GERD (gastroesophageal reflux disease)   . Headache(784.0)   . Hypertension   . Hypothyroidism   . Sleep apnea    uses cpap    Past Surgical History:  Procedure Laterality Date  . CARDIAC CATHETERIZATION     Dr Tresa Endo  . KNEE ARTHROSCOPY Right   . left leg repair of laceration    . LUMBAR FUSION  09/23/2012   Dr Shon Baton  . NASAL SEPTOPLASTY W/  TURBINOPLASTY     Family History:  Family History  Problem Relation Age of Onset  . Fibromyalgia Mother   . Other Mother        back and knee issues  . Fibromyalgia Father   . Other Brother   . Other Brother   . Other Brother    Social History:   Social History   Social History  . Marital status: Married    Spouse name: Lamar Laundry  . Number of children: 2  . 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 Smoking status: Never Smoker  . Smokeless tobacco: Never Used  . Alcohol use Yes     Comment: drink once a month  . Drug use: No  . Sexual activity: Yes    Partners: Female   Other Topics Concern  . None   Social History Narrative   Patient lives at home with spouse.   Caffeine Use: 3 cups daily       Psychiatric Specialty Exam: HPI  Review of Systems  Constitutional: Negative for fever.  Cardiovascular: Negative for palpitations.  Gastrointestinal: Negative for nausea.  Skin: Negative for rash.  Neurological: Negative for tremors.  Psychiatric/Behavioral: Negative for depression, substance abuse and suicidal ideas.    Blood pressure 128/76, pulse 67, resp. rate 18, height  (1.93 m), weight (!) 313  lb (142 kg), SpO2 96 %.Body mass index is 38.1 kg/m.  General Appearance: Casual  Eye Contact:  Fair  Speech:  Normal Rate  Volume:  Normal  Mood: fair, better  Affect:  congruent  Thought Process:  Coherent  Orientation:  Full (Time, Place, and Person)  Thought Content:  Rumination  Suicidal Thoughts:  No  Homicidal Thoughts:  No  Memory:  Immediate;   Fair Recent;   Fair  Judgement:  Fair  Insight:  Shallow  Psychomotor Activity:  Decreased  Concentration:  Fair  Recall:  [employer] of Knowledge:Fair  Language: Fair  Akathisia:  Negative  Handed:  Right  AIMS (if indicated):    Assets:  Desire for Improvement Social Support  ADL's:  Intact  Cognition: WNL   Sleep:  variable    Allergies:   Allergies  Allergen Reactions  . Bupropion Other (See Comments)    Headaches   Current Medications: Current Outpatient Prescriptions  Medication Sig Dispense Refill  . Aspirin-Acetaminophen-Caffeine (GOODYS EXTRA STRENGTH PO) Take 4 packets by mouth daily.    Marland Kitchen atenolol-chlorthalidone (TENORETIC) 50-25 MG per tablet Take 1 tablet by mouth daily.    . Cholecalciferol (VITAMIN D3) 2000 UNITS TABS Take 1 tablet by mouth daily.    . DULoxetine (CYMBALTA) 30 MG capsule Take 3 times daily. Please discontinue all previous prescriptions. 270 capsule 0  . fluticasone (FLONASE) 50 MCG/ACT nasal spray     . gabapentin (NEURONTIN) 600 MG tablet Take 600 mg by mouth 2 (two) times daily. Reported on 05/10/2015    . lamoTRIgine (LAMICTAL) 100 MG tablet Take 1 tablet (100 mg total) by mouth daily. 90 tablet 0  . levothyroxine (SYNTHROID) 150 MCG tablet Take by mouth.    . pantoprazole (PROTONIX) 40 MG tablet Take 40 mg by mouth daily.    . vitamin B-12 (CYANOCOBALAMIN) 1000 MCG tablet Take by mouth.     No current facility-administered medications for this visit.       Treatment Plan Summary: Medication management and Plan as follows   Mdd: stable. Continue cymbalta and lamictal No rash  GAD: manageable. Continue cymbalta   Mood disorder: multi factors;improved with outdoor activities. More positive  Positive support system  FU 4 months. Renewed meds. Provided supportive therapy  Claxton Levitz 10/2/20183:41 PM

## 2016-12-30 DIAGNOSIS — Z1211 Encounter for screening for malignant neoplasm of colon: Secondary | ICD-10-CM | POA: Diagnosis not present

## 2017-03-12 DIAGNOSIS — E039 Hypothyroidism, unspecified: Secondary | ICD-10-CM | POA: Diagnosis not present

## 2017-04-07 ENCOUNTER — Ambulatory Visit (INDEPENDENT_AMBULATORY_CARE_PROVIDER_SITE_OTHER): Payer: BLUE CROSS/BLUE SHIELD | Admitting: Psychiatry

## 2017-04-07 ENCOUNTER — Other Ambulatory Visit: Payer: Self-pay

## 2017-04-07 ENCOUNTER — Encounter (HOSPITAL_COMMUNITY): Payer: Self-pay | Admitting: Psychiatry

## 2017-04-07 VITALS — BP 128/88 | HR 68 | Ht 76.0 in | Wt 319.0 lb

## 2017-04-07 DIAGNOSIS — F331 Major depressive disorder, recurrent, moderate: Secondary | ICD-10-CM | POA: Diagnosis not present

## 2017-04-07 DIAGNOSIS — F0630 Mood disorder due to known physiological condition, unspecified: Secondary | ICD-10-CM | POA: Diagnosis not present

## 2017-04-07 DIAGNOSIS — F411 Generalized anxiety disorder: Secondary | ICD-10-CM

## 2017-04-07 MED ORDER — DULOXETINE HCL 30 MG PO CPEP: ORAL_CAPSULE | ORAL | 0 refills | Status: AC

## 2017-04-07 NOTE — Progress Notes (Signed)
Patient ID: Mark Good, male   DOB: 1966-02-17, 52 y.o.   MRN: [id number]  [facility] Outpatient Follow up visit   Patient Identification: Mark Good MRN:  [id number] Date of Evaluation:  04/07/2017 Referral Source: [facility], primary care Chief Complaint:   Chief Complaint    Follow-up; Other     Visit Diagnosis:    ICD-10-CM   1. Moderate episode of recurrent major depressive disorder (HCC) F33.1   2. Mood disorder in conditions classified elsewhere F06.30   3. GAD (generalized anxiety disorder) F41.1    Diagnosis:   Patient Active Problem List   Diagnosis Date Noted  . Acid reflux [K21.9] 05/10/2015  . Adult hypothyroidism [E03.9] 05/10/2015  . Essential (primary) hypertension [I10] 02/19/2015  . Neuropathy [G62.9] 01/10/2015  . Obstructive apnea [G47.33] 01/10/2015  . Hereditary and idiopathic peripheral neuropathy [G60.9] 12/25/2013  . Dysphagia [R13.10] 12/25/2013  . Abnormality of gait [R26.9] 12/25/2013   History of Present Illness:  52 years old currently married Caucasian male initially referred for management of depression.  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  Back condition effects mood . Not taking lamictal. 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   Severity of depression; not worse. 8/10   Past Medical History:  Past Medical History:  Diagnosis Date  . Anxiety   . Depression   . GERD (gastroesophageal reflux disease)   . Headache(784.0)   . Hypertension   . Hypothyroidism   . Sleep apnea    uses cpap    Past Surgical History:  Procedure Laterality Date  . CARDIAC CATHETERIZATION     Dr Tresa Endo  . KNEE ARTHROSCOPY Right   . left leg repair of laceration    . LUMBAR FUSION  09/23/2012   Dr Shon Baton  . NASAL SEPTOPLASTY W/ TURBINOPLASTY     Family History:  Family  History  Problem Relation Age of Onset  . Fibromyalgia Mother   . Other Mother        back and knee issues  . Fibromyalgia Father   . Other Brother   . Other Brother   . Other Brother    Social History:   Social History   Socioeconomic History  . Marital status: Married    Spouse name: Lamar Laundry  . Number of children: 2  . Years of education: Assoc  . Highest education level: None  Social Needs  . Financial resource strain: None  . Food insecurity - worry: None  . Food insecurity - inability: None  . Transportation needs - medical: None  . Transportation needs - non-medical: None  Occupational History  . Occupation: SERVICE ENGINEER TECH    Employer: [employer]  Tobacco Use  . Smoking status: Never Smoker  . Smokeless tobacco: Never Used  Substance and Sexual Activity  . Alcohol use: Yes    Comment: drink once a month  . Drug use: No  . Sexual activity: Yes    Partners: Female  Other Topics Concern  . None  Social History Narrative   Patient lives at home with spouse.   Caffeine Use: 3 cups daily       Psychiatric Specialty Exam: HPI  Review of Systems  Constitutional: Negative for fever.  Cardiovascular: Negative for chest pain.  Gastrointestinal: Negative for nausea.  Skin: Negative  for rash.  Neurological: Negative for tremors.  Psychiatric/Behavioral: Negative for substance abuse and suicidal ideas.    Blood pressure 128/88, pulse 68, height 6\' 4"  (1.93 m), weight (!) 319 lb (144.7 kg).Body mass index is 38.83 kg/m.  General Appearance: Casual  Eye Contact:  Fair  Speech:  Normal Rate  Volume:  Normal  Mood: fair  Affect:  congruent  Thought Process:  Coherent  Orientation:  Full (Time, Place, and Person)  Thought Content:  Rumination  Suicidal Thoughts:  No  Homicidal Thoughts:  No  Memory:  Immediate;   Fair Recent;   Fair  Judgement:  Fair  Insight:  Shallow  Psychomotor Activity:  Decreased  Concentration:  Fair  Recall:  [employer] KodakFair   Fund of Knowledge:Fair  Language: Fair  Akathisia:  Negative  Handed:  Right  AIMS (if indicated):    Assets:  Desire for Improvement Social Support  ADL's:  Intact  Cognition: WNL  Sleep:  variable    Allergies:   Allergies  Allergen Reactions  . Bupropion Other (See Comments)    Headaches   Current Medications: Current Outpatient Medications  Medication Sig Dispense Refill  . Aspirin-Acetaminophen-Caffeine (GOODYS EXTRA STRENGTH PO) Take 4 packets by mouth daily.    Marland Kitchen. atenolol-chlorthalidone (TENORETIC) 50-25 MG per tablet Take 1 tablet by mouth daily.    . Cholecalciferol (VITAMIN D3) 2000 UNITS TABS Take 1 tablet by mouth daily.    . DULoxetine (CYMBALTA) 30 MG capsule Take 3 times daily. Please discontinue all previous prescriptions. 270 capsule 0  . fluticasone (FLONASE) 50 MCG/ACT nasal spray     . gabapentin (NEURONTIN) 600 MG tablet Take 600 mg by mouth 2 (two) times daily. Reported on 05/10/2015    . levothyroxine (SYNTHROID) 150 MCG tablet Take by mouth.    . pantoprazole (PROTONIX) 40 MG tablet Take 40 mg by mouth daily.    . vitamin B-12 (CYANOCOBALAMIN) 1000 MCG tablet Take by mouth.     No current facility-administered medications for this visit.       Treatment Plan Summary: Medication management and Plan as follows   Mdd: stable. Continue cymbalta. Has stopped lamictal  GAD: not worse. Continue cymbalta   Mood disorder: multi factors;improved with outdoor activities. Good support. Doing fair. Continue cymbalta Renew meds fu 3 months  Taesean Reth 1/29/20193:28 PM

## 2017-06-08 DIAGNOSIS — Z79899 Other long term (current) drug therapy: Secondary | ICD-10-CM | POA: Diagnosis not present

## 2017-06-08 DIAGNOSIS — R5383 Other fatigue: Secondary | ICD-10-CM | POA: Diagnosis not present

## 2017-06-08 DIAGNOSIS — I10 Essential (primary) hypertension: Secondary | ICD-10-CM | POA: Diagnosis not present

## 2017-06-08 DIAGNOSIS — K219 Gastro-esophageal reflux disease without esophagitis: Secondary | ICD-10-CM | POA: Diagnosis not present

## 2017-06-08 DIAGNOSIS — E039 Hypothyroidism, unspecified: Secondary | ICD-10-CM | POA: Diagnosis not present

## 2017-06-08 DIAGNOSIS — E782 Mixed hyperlipidemia: Secondary | ICD-10-CM | POA: Diagnosis not present

## 2017-06-08 DIAGNOSIS — M4727 Other spondylosis with radiculopathy, lumbosacral region: Secondary | ICD-10-CM | POA: Diagnosis not present

## 2017-07-13 ENCOUNTER — Ambulatory Visit (HOSPITAL_COMMUNITY): Payer: Self-pay | Admitting: Psychiatry

## 2017-12-28 DIAGNOSIS — E7800 Pure hypercholesterolemia, unspecified: Secondary | ICD-10-CM | POA: Diagnosis not present

## 2017-12-28 DIAGNOSIS — E039 Hypothyroidism, unspecified: Secondary | ICD-10-CM | POA: Diagnosis not present

## 2017-12-28 DIAGNOSIS — Z23 Encounter for immunization: Secondary | ICD-10-CM | POA: Diagnosis not present

## 2017-12-28 DIAGNOSIS — F330 Major depressive disorder, recurrent, mild: Secondary | ICD-10-CM | POA: Diagnosis not present

## 2017-12-28 DIAGNOSIS — R7303 Prediabetes: Secondary | ICD-10-CM | POA: Diagnosis not present

## 2018-04-26 DIAGNOSIS — E1169 Type 2 diabetes mellitus with other specified complication: Secondary | ICD-10-CM | POA: Diagnosis not present

## 2018-04-26 DIAGNOSIS — Z23 Encounter for immunization: Secondary | ICD-10-CM | POA: Diagnosis not present

## 2018-05-06 DIAGNOSIS — H2513 Age-related nuclear cataract, bilateral: Secondary | ICD-10-CM | POA: Diagnosis not present

## 2018-05-06 DIAGNOSIS — Z7984 Long term (current) use of oral hypoglycemic drugs: Secondary | ICD-10-CM | POA: Diagnosis not present

## 2018-05-06 DIAGNOSIS — H04123 Dry eye syndrome of bilateral lacrimal glands: Secondary | ICD-10-CM | POA: Diagnosis not present

## 2018-05-06 DIAGNOSIS — E119 Type 2 diabetes mellitus without complications: Secondary | ICD-10-CM | POA: Diagnosis not present

## 2018-09-29 DIAGNOSIS — E039 Hypothyroidism, unspecified: Secondary | ICD-10-CM | POA: Diagnosis not present

## 2018-09-29 DIAGNOSIS — R310 Gross hematuria: Secondary | ICD-10-CM | POA: Diagnosis not present

## 2018-09-29 DIAGNOSIS — Z0000 Encounter for general adult medical examination without abnormal findings: Secondary | ICD-10-CM | POA: Diagnosis not present

## 2018-09-29 DIAGNOSIS — I10 Essential (primary) hypertension: Secondary | ICD-10-CM | POA: Diagnosis not present

## 2018-09-29 DIAGNOSIS — E781 Pure hyperglyceridemia: Secondary | ICD-10-CM | POA: Diagnosis not present

## 2018-09-29 DIAGNOSIS — E7800 Pure hypercholesterolemia, unspecified: Secondary | ICD-10-CM | POA: Diagnosis not present

## 2018-09-29 DIAGNOSIS — E1169 Type 2 diabetes mellitus with other specified complication: Secondary | ICD-10-CM | POA: Diagnosis not present

## 2018-09-29 DIAGNOSIS — Z23 Encounter for immunization: Secondary | ICD-10-CM | POA: Diagnosis not present

## 2018-10-24 DIAGNOSIS — M25511 Pain in right shoulder: Secondary | ICD-10-CM | POA: Diagnosis not present

## 2018-10-24 DIAGNOSIS — S51011A Laceration without foreign body of right elbow, initial encounter: Secondary | ICD-10-CM | POA: Diagnosis not present

## 2018-11-02 DIAGNOSIS — M75101 Unspecified rotator cuff tear or rupture of right shoulder, not specified as traumatic: Secondary | ICD-10-CM | POA: Diagnosis not present

## 2018-11-16 DIAGNOSIS — M75101 Unspecified rotator cuff tear or rupture of right shoulder, not specified as traumatic: Secondary | ICD-10-CM | POA: Diagnosis not present

## 2018-11-16 DIAGNOSIS — M25511 Pain in right shoulder: Secondary | ICD-10-CM | POA: Diagnosis not present

## 2018-11-25 DIAGNOSIS — M75101 Unspecified rotator cuff tear or rupture of right shoulder, not specified as traumatic: Secondary | ICD-10-CM | POA: Diagnosis not present

## 2018-11-30 DIAGNOSIS — M75101 Unspecified rotator cuff tear or rupture of right shoulder, not specified as traumatic: Secondary | ICD-10-CM | POA: Diagnosis not present

## 2018-11-30 DIAGNOSIS — R001 Bradycardia, unspecified: Secondary | ICD-10-CM | POA: Diagnosis not present

## 2018-11-30 DIAGNOSIS — Z20828 Contact with and (suspected) exposure to other viral communicable diseases: Secondary | ICD-10-CM | POA: Diagnosis not present

## 2018-11-30 DIAGNOSIS — Z01812 Encounter for preprocedural laboratory examination: Secondary | ICD-10-CM | POA: Diagnosis not present

## 2018-12-02 DIAGNOSIS — M75101 Unspecified rotator cuff tear or rupture of right shoulder, not specified as traumatic: Secondary | ICD-10-CM | POA: Diagnosis not present

## 2018-12-02 DIAGNOSIS — Z01812 Encounter for preprocedural laboratory examination: Secondary | ICD-10-CM | POA: Diagnosis not present

## 2018-12-02 DIAGNOSIS — Z20828 Contact with and (suspected) exposure to other viral communicable diseases: Secondary | ICD-10-CM | POA: Diagnosis not present

## 2018-12-08 DIAGNOSIS — M75101 Unspecified rotator cuff tear or rupture of right shoulder, not specified as traumatic: Secondary | ICD-10-CM | POA: Diagnosis not present

## 2018-12-08 DIAGNOSIS — M7551 Bursitis of right shoulder: Secondary | ICD-10-CM | POA: Diagnosis not present

## 2018-12-08 DIAGNOSIS — W19XXXA Unspecified fall, initial encounter: Secondary | ICD-10-CM | POA: Diagnosis not present

## 2018-12-08 DIAGNOSIS — M65811 Other synovitis and tenosynovitis, right shoulder: Secondary | ICD-10-CM | POA: Diagnosis not present

## 2018-12-08 DIAGNOSIS — G8918 Other acute postprocedural pain: Secondary | ICD-10-CM | POA: Diagnosis not present

## 2018-12-08 DIAGNOSIS — S46011A Strain of muscle(s) and tendon(s) of the rotator cuff of right shoulder, initial encounter: Secondary | ICD-10-CM | POA: Diagnosis not present

## 2019-01-06 DIAGNOSIS — M25511 Pain in right shoulder: Secondary | ICD-10-CM | POA: Diagnosis not present

## 2019-01-06 DIAGNOSIS — M25611 Stiffness of right shoulder, not elsewhere classified: Secondary | ICD-10-CM | POA: Diagnosis not present

## 2019-01-06 DIAGNOSIS — M25411 Effusion, right shoulder: Secondary | ICD-10-CM | POA: Diagnosis not present

## 2019-01-12 DIAGNOSIS — M25611 Stiffness of right shoulder, not elsewhere classified: Secondary | ICD-10-CM | POA: Diagnosis not present

## 2019-01-12 DIAGNOSIS — M25511 Pain in right shoulder: Secondary | ICD-10-CM | POA: Diagnosis not present

## 2019-01-12 DIAGNOSIS — M25411 Effusion, right shoulder: Secondary | ICD-10-CM | POA: Diagnosis not present

## 2019-01-14 DIAGNOSIS — M7582 Other shoulder lesions, left shoulder: Secondary | ICD-10-CM | POA: Diagnosis not present

## 2019-01-19 DIAGNOSIS — M25411 Effusion, right shoulder: Secondary | ICD-10-CM | POA: Diagnosis not present

## 2019-01-19 DIAGNOSIS — M25611 Stiffness of right shoulder, not elsewhere classified: Secondary | ICD-10-CM | POA: Diagnosis not present

## 2019-01-19 DIAGNOSIS — M25511 Pain in right shoulder: Secondary | ICD-10-CM | POA: Diagnosis not present

## 2019-01-21 DIAGNOSIS — M25511 Pain in right shoulder: Secondary | ICD-10-CM | POA: Diagnosis not present

## 2019-01-21 DIAGNOSIS — M25611 Stiffness of right shoulder, not elsewhere classified: Secondary | ICD-10-CM | POA: Diagnosis not present

## 2019-01-21 DIAGNOSIS — M25411 Effusion, right shoulder: Secondary | ICD-10-CM | POA: Diagnosis not present

## 2019-01-24 DIAGNOSIS — M25611 Stiffness of right shoulder, not elsewhere classified: Secondary | ICD-10-CM | POA: Diagnosis not present

## 2019-01-24 DIAGNOSIS — M25511 Pain in right shoulder: Secondary | ICD-10-CM | POA: Diagnosis not present

## 2019-01-24 DIAGNOSIS — M25411 Effusion, right shoulder: Secondary | ICD-10-CM | POA: Diagnosis not present

## 2019-01-26 DIAGNOSIS — M25511 Pain in right shoulder: Secondary | ICD-10-CM | POA: Diagnosis not present

## 2019-01-26 DIAGNOSIS — M25611 Stiffness of right shoulder, not elsewhere classified: Secondary | ICD-10-CM | POA: Diagnosis not present

## 2019-01-26 DIAGNOSIS — M25411 Effusion, right shoulder: Secondary | ICD-10-CM | POA: Diagnosis not present

## 2019-02-01 DIAGNOSIS — M25511 Pain in right shoulder: Secondary | ICD-10-CM | POA: Diagnosis not present

## 2019-02-01 DIAGNOSIS — M25411 Effusion, right shoulder: Secondary | ICD-10-CM | POA: Diagnosis not present

## 2019-02-01 DIAGNOSIS — M25611 Stiffness of right shoulder, not elsewhere classified: Secondary | ICD-10-CM | POA: Diagnosis not present

## 2019-02-10 ENCOUNTER — Other Ambulatory Visit: Payer: Self-pay

## 2019-02-10 ENCOUNTER — Encounter (HOSPITAL_BASED_OUTPATIENT_CLINIC_OR_DEPARTMENT_OTHER): Payer: Self-pay | Admitting: *Deleted

## 2019-02-10 ENCOUNTER — Emergency Department (HOSPITAL_BASED_OUTPATIENT_CLINIC_OR_DEPARTMENT_OTHER): Payer: BC Managed Care – PPO

## 2019-02-10 ENCOUNTER — Inpatient Hospital Stay (HOSPITAL_BASED_OUTPATIENT_CLINIC_OR_DEPARTMENT_OTHER)
Admission: EM | Admit: 2019-02-10 | Discharge: 2019-02-13 | DRG: 603 | Disposition: A | Discharge status: Home / Self Care | Payer: BC Managed Care – PPO | Source: Other Acute Inpatient Hospital | Attending: Internal Medicine | Admitting: Internal Medicine

## 2019-02-10 DIAGNOSIS — Z6841 Body Mass Index (BMI) 40.0 and over, adult: Secondary | ICD-10-CM

## 2019-02-10 DIAGNOSIS — Z79899 Other long term (current) drug therapy: Secondary | ICD-10-CM

## 2019-02-10 DIAGNOSIS — E876 Hypokalemia: Secondary | ICD-10-CM | POA: Diagnosis not present

## 2019-02-10 DIAGNOSIS — L03116 Cellulitis of left lower limb: Secondary | ICD-10-CM | POA: Diagnosis present

## 2019-02-10 DIAGNOSIS — E039 Hypothyroidism, unspecified: Secondary | ICD-10-CM | POA: Diagnosis present

## 2019-02-10 DIAGNOSIS — Z79890 Hormone replacement therapy (postmenopausal): Secondary | ICD-10-CM | POA: Diagnosis not present

## 2019-02-10 DIAGNOSIS — E871 Hypo-osmolality and hyponatremia: Secondary | ICD-10-CM | POA: Diagnosis present

## 2019-02-10 DIAGNOSIS — Z981 Arthrodesis status: Secondary | ICD-10-CM

## 2019-02-10 DIAGNOSIS — Z888 Allergy status to other drugs, medicaments and biological substances status: Secondary | ICD-10-CM | POA: Diagnosis not present

## 2019-02-10 DIAGNOSIS — Z03818 Encounter for observation for suspected exposure to other biological agents ruled out: Secondary | ICD-10-CM | POA: Diagnosis not present

## 2019-02-10 DIAGNOSIS — K219 Gastro-esophageal reflux disease without esophagitis: Secondary | ICD-10-CM | POA: Diagnosis present

## 2019-02-10 DIAGNOSIS — I891 Lymphangitis: Principal | ICD-10-CM | POA: Diagnosis present

## 2019-02-10 DIAGNOSIS — I10 Essential (primary) hypertension: Secondary | ICD-10-CM | POA: Diagnosis not present

## 2019-02-10 DIAGNOSIS — F329 Major depressive disorder, single episode, unspecified: Secondary | ICD-10-CM | POA: Diagnosis present

## 2019-02-10 DIAGNOSIS — Z20828 Contact with and (suspected) exposure to other viral communicable diseases: Secondary | ICD-10-CM | POA: Diagnosis present

## 2019-02-10 DIAGNOSIS — Z7984 Long term (current) use of oral hypoglycemic drugs: Secondary | ICD-10-CM | POA: Diagnosis not present

## 2019-02-10 DIAGNOSIS — Z885 Allergy status to narcotic agent status: Secondary | ICD-10-CM

## 2019-02-10 DIAGNOSIS — I361 Nonrheumatic tricuspid (valve) insufficiency: Secondary | ICD-10-CM | POA: Diagnosis not present

## 2019-02-10 DIAGNOSIS — E119 Type 2 diabetes mellitus without complications: Secondary | ICD-10-CM | POA: Diagnosis present

## 2019-02-10 DIAGNOSIS — G4733 Obstructive sleep apnea (adult) (pediatric): Secondary | ICD-10-CM | POA: Diagnosis not present

## 2019-02-10 DIAGNOSIS — M79605 Pain in left leg: Secondary | ICD-10-CM | POA: Diagnosis not present

## 2019-02-10 DIAGNOSIS — M7989 Other specified soft tissue disorders: Secondary | ICD-10-CM | POA: Diagnosis not present

## 2019-02-10 LAB — CBC WITH DIFFERENTIAL/PLATELET
Abs Immature Granulocytes: 0.06 10*3/uL (ref 0.00–0.07)
Basophils Absolute: 0 10*3/uL (ref 0.0–0.1)
Basophils Relative: 0 %
Eosinophils Absolute: 0 10*3/uL (ref 0.0–0.5)
Eosinophils Relative: 0 %
HCT: 43.2 % (ref 39.0–52.0)
Hemoglobin: 14.7 g/dL (ref 13.0–17.0)
Immature Granulocytes: 1 %
Lymphocytes Relative: 8 %
Lymphs Abs: 1 10*3/uL (ref 0.7–4.0)
MCH: 30.2 pg (ref 26.0–34.0)
MCHC: 34 g/dL (ref 30.0–36.0)
MCV: 88.9 fL (ref 80.0–100.0)
Monocytes Absolute: 0.9 10*3/uL (ref 0.1–1.0)
Monocytes Relative: 8 %
Neutro Abs: 9.9 10*3/uL — ABNORMAL HIGH (ref 1.7–7.7)
Neutrophils Relative %: 83 %
Platelets: 257 10*3/uL (ref 150–400)
RBC: 4.86 MIL/uL (ref 4.22–5.81)
RDW: 13 % (ref 11.5–15.5)
WBC: 11.9 10*3/uL — ABNORMAL HIGH (ref 4.0–10.5)
nRBC: 0 % (ref 0.0–0.2)

## 2019-02-10 LAB — COMPREHENSIVE METABOLIC PANEL
ALT: 28 U/L (ref 0–44)
AST: 23 U/L (ref 15–41)
Albumin: 4.4 g/dL (ref 3.5–5.0)
Alkaline Phosphatase: 71 U/L (ref 38–126)
Anion gap: 13 (ref 5–15)
BUN: 15 mg/dL (ref 6–20)
CO2: 24 mmol/L (ref 22–32)
Calcium: 9.5 mg/dL (ref 8.9–10.3)
Chloride: 85 mmol/L — ABNORMAL LOW (ref 98–111)
Creatinine, Ser: 1.09 mg/dL (ref 0.61–1.24)
GFR calc Af Amer: >60 mL/min (ref >60)
GFR calc non Af Amer: >60 mL/min (ref >60)
Glucose, Bld: 121 mg/dL — ABNORMAL HIGH (ref 70–99)
Potassium: 4 mmol/L (ref 3.5–5.1)
Sodium: 122 mmol/L — ABNORMAL LOW (ref 135–145)
Total Bilirubin: 1.2 mg/dL (ref 0.3–1.2)
Total Protein: 8.6 g/dL — ABNORMAL HIGH (ref 6.5–8.1)

## 2019-02-10 LAB — LACTIC ACID, PLASMA: Lactic Acid, Venous: 2.1 mmol/L (ref 0.5–1.9)

## 2019-02-10 MED ORDER — DOXYCYCLINE HYCLATE 100 MG PO TABS
100.0000 mg | ORAL_TABLET | Freq: Once | ORAL | Status: AC
Start: 2019-02-10 — End: 2019-02-10
  Administered 2019-02-10: 100 mg via ORAL
  Filled 2019-02-10: qty 1

## 2019-02-10 MED ORDER — SODIUM CHLORIDE 0.9 % IV BOLUS
500.0000 mL | Freq: Once | INTRAVENOUS | Status: AC
  Administered 2019-02-10: 500 mL via INTRAVENOUS

## 2019-02-10 MED ORDER — ENOXAPARIN SODIUM 150 MG/ML ~~LOC~~ SOLN
145.0000 mg | Freq: Once | SUBCUTANEOUS | Status: AC
  Administered 2019-02-10: 145 mg via SUBCUTANEOUS
  Filled 2019-02-10: qty 1

## 2019-02-10 MED ORDER — HYDROCODONE-ACETAMINOPHEN 5-325 MG PO TABS
1.0000 | ORAL_TABLET | Freq: Once | ORAL | Status: AC
  Administered 2019-02-10: 1 via ORAL
  Filled 2019-02-10: qty 1

## 2019-02-10 MED ORDER — VANCOMYCIN HCL IN DEXTROSE 1-5 GM/200ML-% IV SOLN
1000.0000 mg | Freq: Once | INTRAVENOUS | Status: AC
  Administered 2019-02-10: 1000 mg via INTRAVENOUS
  Filled 2019-02-10: qty 200

## 2019-02-10 NOTE — ED Provider Notes (Signed)
[facility] EMERGENCY DEPARTMENT Provider Note   CSN: [id number] Arrival date & time: 02/10/19  2010     History   Chief Complaint Chief Complaint  Patient presents with  . Leg Pain    HPI Mark Good is a 53 y.o. male.     Patient with history of hypertension, sleep apnea, depression here with leg pain or leg swelling.  Urgent care sent him over for concern for DVT.  States he injured his groin yesterday when he was trying to get out of a stool but did not fall or hit his head.  He then noticed there is some burning and redness and pain to his left leg with streaks up to his lower leg to his thigh.  It is warm and red.  Is been no fever.  Does have been no vomiting.  No chest pain or shortness of breath.  He was seen at urgent care and sent here to rule out DVT with Dopplers not available at this time.  No history of blood clot.  No focal weakness, numbness or tingling.  No pain with urination or blood in the urine.  The history is provided by the patient.    Past Medical History:  Diagnosis Date  . Anxiety   . Depression   . GERD (gastroesophageal reflux disease)   . Headache(784.0)   . Hypertension   . Hypothyroidism   . Sleep apnea    uses cpap    Patient Active Problem List   Diagnosis Date Noted  . Acid reflux 05/10/2015  . Adult hypothyroidism 05/10/2015  . Essential (primary) hypertension 02/19/2015  . Neuropathy 01/10/2015  . Obstructive apnea 01/10/2015  . 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 Dysphagia 12/25/2013  . Abnormality of gait 12/25/2013    Past Surgical History:  Procedure Laterality Date  . CARDIAC CATHETERIZATION     Dr Tresa EndoKelly  . KNEE ARTHROSCOPY Right   . left leg repair of laceration    . LUMBAR FUSION  09/23/2012   Dr Shon BatonBrooks  . NASAL SEPTOPLASTY W/ TURBINOPLASTY          Home Medications    Prior to Admission medications   Medication Sig Start Date End Date Taking? Authorizing Provider   Aspirin-Acetaminophen-Caffeine (GOODYS EXTRA STRENGTH PO) Take 4 packets by mouth daily.    [provider]  atenolol-chlorthalidone (TENORETIC) 50-25 MG per tablet Take 1 tablet by mouth daily.    [provider]  Cholecalciferol (VITAMIN D3) 2000 UNITS TABS Take 1 tablet by mouth daily.    [provider]  DULoxetine (CYMBALTA) 30 MG capsule Take 3 times daily. Please discontinue all previous prescriptions. 04/07/17   Thresa RossAkhtar, Nadeem, MD  fluticasone Aleda Grana(FLONASE) 50 MCG/ACT nasal spray  10/27/14   [provider]  gabapentin (NEURONTIN) 600 MG tablet Take 600 mg by mouth 2 (two) times daily. Reported on 05/10/2015    [provider]  levothyroxine (SYNTHROID) 150 MCG tablet Take by mouth. 04/17/15   [provider]  pantoprazole (PROTONIX) 40 MG tablet Take 40 mg by mouth daily.    [provider]  vitamin B-12 (CYANOCOBALAMIN) 1000 MCG tablet Take by mouth.    [provider]  lamoTRIgine (LAMICTAL) 100 MG tablet Take 1 tablet (100 mg total) by mouth daily. Patient not taking: Reported on 04/07/2017 12/09/16 04/07/17  Thresa RossAkhtar, Nadeem, MD    Family History Family History  Problem Relation Age of Onset  . Fibromyalgia Mother   . Other Mother  back and knee issues  . Fibromyalgia Father   . Other Brother   . Other Brother   . Other Brother     Social History Social History   Tobacco Use  . Smoking status: Never Smoker  . Smokeless tobacco: Never Used  Substance Use Topics  . Alcohol use: Yes    Comment: drink once a month  . Drug use: No     Allergies   Bupropion   Review of Systems Review of Systems  Constitutional: Negative for activity change, appetite change, fatigue and fever.  HENT: Negative for congestion.   Eyes: Negative for visual disturbance.  Respiratory: Negative for cough, chest tightness and shortness of breath.   Cardiovascular: Negative for chest pain.  Gastrointestinal: Negative for  abdominal pain, nausea and vomiting.  Genitourinary: Negative for dysuria and hematuria.  Musculoskeletal: Positive for arthralgias.  Skin: Positive for rash and wound.  Neurological: Negative for dizziness, weakness and headaches.    all other systems are negative except as noted in the HPI and PMH.    Physical Exam Updated Vital Signs BP (!) 150/98   Pulse 85   Temp 98.7 F (37.1 C) (Oral)   Resp 18   SpO2 100%   Physical Exam Vitals signs and nursing note reviewed.  Constitutional:      General: He is not in acute distress.    Appearance: He is well-developed. He is obese.  HENT:     Head: Normocephalic and atraumatic.     Mouth/Throat:     Pharynx: No oropharyngeal exudate.  Eyes:     Conjunctiva/sclera: Conjunctivae normal.     Pupils: Pupils are equal, round, and reactive to light.  Neck:     Musculoskeletal: Normal range of motion and neck supple.     Comments: No meningismus. Cardiovascular:     Rate and Rhythm: Normal rate and regular rhythm.     Heart sounds: Normal heart sounds. No murmur.  Pulmonary:     Effort: Pulmonary effort is normal. No respiratory distress.     Breath sounds: Normal breath sounds.  Abdominal:     Palpations: Abdomen is soft.     Tenderness: There is no abdominal tenderness. There is no guarding or rebound.  Musculoskeletal: Normal range of motion.        General: Swelling and tenderness present.     Left lower leg: Edema present.     Comments: There is swelling to the left lower leg with circumferential warmth and erythema of the shin extending up the thigh and medial knee.   Intact DP and PT pulses. Compartments soft. Full range of motion of hip with minimal pain. Left knee range of motion intact, ankle and extension intact.  Skin:    General: Skin is warm.     Capillary Refill: Capillary refill takes less than 2 seconds.  Neurological:     General: No focal deficit present.     Mental Status: He is alert and oriented to  person, place, and time. Mental status is at baseline.     Cranial Nerves: No cranial nerve deficit.     Motor: No abnormal muscle tone.     Coordination: Coordination normal.     Comments: No ataxia on finger to nose bilaterally. No pronator drift. 5/5 strength throughout. CN 2-12 intact.Equal grip strength. Sensation intact.   Psychiatric:        Behavior: Behavior normal.           ED Treatments / Results  Labs (all labs ordered are listed, but only abnormal results are displayed) Labs Reviewed  CBC WITH DIFFERENTIAL/PLATELET - Abnormal; Notable for the following components:      Result Value   WBC 11.9 (*)    Neutro Abs 9.9 (*)    All other components within normal limits  COMPREHENSIVE METABOLIC PANEL - Abnormal; Notable for the following components:   Sodium 122 (*)    Chloride 85 (*)    Glucose, Bld 121 (*)    Total Protein 8.6 (*)    All other components within normal limits  LACTIC ACID, PLASMA - Abnormal; Notable for the following components:   Lactic Acid, Venous 2.1 (*)    All other components within normal limits  CULTURE, BLOOD (ROUTINE X 2)  CULTURE, BLOOD (ROUTINE X 2)  SARS CORONAVIRUS 2 (TAT 6-24 HRS)  LACTIC ACID, PLASMA    EKG None  Radiology Dg Tibia/fibula Left  Result Date: 02/10/2019 CLINICAL DATA:  Left leg pain redness and swelling EXAM: LEFT TIBIA AND FIBULA - 2 VIEW COMPARISON:  None. FINDINGS: There is no evidence of fracture or other focal bone lesions. Mild soft tissue swelling seen around the lower extremity. Soft tissue calcifications seen posterior to the knee. IMPRESSION: No acute osseous abnormality. Electronically Signed   By: Jonna Clark M.D.   On: 02/10/2019 22:36    Procedures Procedures (including critical care time)  Medications Ordered in ED Medications  enoxaparin (LOVENOX) injection 145 mg (has no administration in time range)  doxycycline (VIBRA-TABS) tablet 100 mg (has no administration in time range)   HYDROcodone-acetaminophen (NORCO/VICODIN) 5-325 MG per tablet 1 tablet (has no administration in time range)     Initial Impression / Assessment and Plan / ED Course  I have reviewed the triage vital signs and the nursing notes.  Pertinent labs & imaging results that were available during my care of the patient were reviewed by me and considered in my medical decision making (see chart for details).       Redness, warmth and swelling to left leg x2 days.  No fever.  Exam concerning for cellulitis and lymphangitis.  Intact distal pulses.  Dopplers not available.  Will give p.o. doxycycline and start Lovenox.  Patient found to have incidental hyponatremia of 122.  He does take a diuretic.  Denies any excessive alcohol use.  Given degree of hyponatremia as well as lymphangitis and leg pain and leg swelling with need for Doppler study, will plan observation admission to correct his hyponatremia and obtain Doppler in the morning.  We will start IV vancomycin.  Admission discussed with Dr. Debby Bud.  Mark Good was evaluated in Emergency Department on 02/10/2019 for the symptoms described in the history of present illness. He was evaluated in the context of the global COVID-19 pandemic, which necessitated consideration that the patient might be at risk for infection with the SARS-CoV-2 virus that causes COVID-19. Institutional protocols and algorithms that pertain to the evaluation of patients at risk for COVID-19 are in a state of rapid change based on information released by regulatory bodies including the CDC and federal and state organizations. These policies and algorithms were followed during the patient's care in the ED.  Final Clinical Impressions(s) / ED Diagnoses   Final diagnoses:  Hyponatremia  Lymphangitis    ED Discharge Orders    None       Argentina Kosch, Jeannett Senior, MD 02/10/19 2317

## 2019-02-10 NOTE — ED Notes (Signed)
Critical result received lactic acid 2.1; Dr Wyvonnia Dusky aware.

## 2019-02-10 NOTE — ED Notes (Signed)
Patient transported to X-ray 

## 2019-02-10 NOTE — ED Triage Notes (Signed)
Pt c/o ;left leg redness and pain x 2 days , sent here for [place] by urgent care

## 2019-02-11 ENCOUNTER — Other Ambulatory Visit (HOSPITAL_COMMUNITY): Payer: BC Managed Care – PPO

## 2019-02-11 ENCOUNTER — Observation Stay (HOSPITAL_COMMUNITY): Payer: BC Managed Care – PPO

## 2019-02-11 DIAGNOSIS — Z7984 Long term (current) use of oral hypoglycemic drugs: Secondary | ICD-10-CM | POA: Diagnosis not present

## 2019-02-11 DIAGNOSIS — I10 Essential (primary) hypertension: Secondary | ICD-10-CM

## 2019-02-11 DIAGNOSIS — Z79899 Other long term (current) drug therapy: Secondary | ICD-10-CM | POA: Diagnosis not present

## 2019-02-11 DIAGNOSIS — E119 Type 2 diabetes mellitus without complications: Secondary | ICD-10-CM | POA: Diagnosis present

## 2019-02-11 DIAGNOSIS — I891 Lymphangitis: Secondary | ICD-10-CM | POA: Diagnosis present

## 2019-02-11 DIAGNOSIS — L03116 Cellulitis of left lower limb: Secondary | ICD-10-CM | POA: Diagnosis present

## 2019-02-11 DIAGNOSIS — I361 Nonrheumatic tricuspid (valve) insufficiency: Secondary | ICD-10-CM | POA: Diagnosis not present

## 2019-02-11 DIAGNOSIS — E871 Hypo-osmolality and hyponatremia: Secondary | ICD-10-CM | POA: Diagnosis present

## 2019-02-11 DIAGNOSIS — E039 Hypothyroidism, unspecified: Secondary | ICD-10-CM

## 2019-02-11 DIAGNOSIS — Z888 Allergy status to other drugs, medicaments and biological substances status: Secondary | ICD-10-CM | POA: Diagnosis not present

## 2019-02-11 DIAGNOSIS — M7989 Other specified soft tissue disorders: Secondary | ICD-10-CM | POA: Diagnosis not present

## 2019-02-11 DIAGNOSIS — Z6841 Body Mass Index (BMI) 40.0 and over, adult: Secondary | ICD-10-CM | POA: Diagnosis not present

## 2019-02-11 DIAGNOSIS — K219 Gastro-esophageal reflux disease without esophagitis: Secondary | ICD-10-CM | POA: Diagnosis present

## 2019-02-11 DIAGNOSIS — G4733 Obstructive sleep apnea (adult) (pediatric): Secondary | ICD-10-CM | POA: Diagnosis present

## 2019-02-11 DIAGNOSIS — E876 Hypokalemia: Secondary | ICD-10-CM | POA: Diagnosis present

## 2019-02-11 DIAGNOSIS — Z885 Allergy status to narcotic agent status: Secondary | ICD-10-CM | POA: Diagnosis not present

## 2019-02-11 DIAGNOSIS — Z981 Arthrodesis status: Secondary | ICD-10-CM | POA: Diagnosis not present

## 2019-02-11 DIAGNOSIS — F329 Major depressive disorder, single episode, unspecified: Secondary | ICD-10-CM | POA: Diagnosis present

## 2019-02-11 DIAGNOSIS — Z79890 Hormone replacement therapy (postmenopausal): Secondary | ICD-10-CM | POA: Diagnosis not present

## 2019-02-11 DIAGNOSIS — Z20828 Contact with and (suspected) exposure to other viral communicable diseases: Secondary | ICD-10-CM | POA: Diagnosis present

## 2019-02-11 LAB — CBC WITH DIFFERENTIAL/PLATELET
Abs Immature Granulocytes: 0.05 10*3/uL (ref 0.00–0.07)
Basophils Absolute: 0 10*3/uL (ref 0.0–0.1)
Basophils Relative: 0 %
Eosinophils Absolute: 0 10*3/uL (ref 0.0–0.5)
Eosinophils Relative: 1 %
HCT: 36.2 % — ABNORMAL LOW (ref 39.0–52.0)
Hemoglobin: 12.5 g/dL — ABNORMAL LOW (ref 13.0–17.0)
Immature Granulocytes: 1 %
Lymphocytes Relative: 9 %
Lymphs Abs: 0.7 10*3/uL (ref 0.7–4.0)
MCH: 30.1 pg (ref 26.0–34.0)
MCHC: 34.5 g/dL (ref 30.0–36.0)
MCV: 87.2 fL (ref 80.0–100.0)
Monocytes Absolute: 0.6 10*3/uL (ref 0.1–1.0)
Monocytes Relative: 7 %
Neutro Abs: 6.9 10*3/uL (ref 1.7–7.7)
Neutrophils Relative %: 82 %
Platelets: 192 10*3/uL (ref 150–400)
RBC: 4.15 MIL/uL — ABNORMAL LOW (ref 4.22–5.81)
RDW: 13 % (ref 11.5–15.5)
WBC: 8.3 10*3/uL (ref 4.0–10.5)
nRBC: 0 % (ref 0.0–0.2)

## 2019-02-11 LAB — BASIC METABOLIC PANEL
Anion gap: 14 (ref 5–15)
BUN: 17 mg/dL (ref 6–20)
CO2: 21 mmol/L — ABNORMAL LOW (ref 22–32)
Calcium: 8.8 mg/dL — ABNORMAL LOW (ref 8.9–10.3)
Chloride: 88 mmol/L — ABNORMAL LOW (ref 98–111)
Creatinine, Ser: 0.93 mg/dL (ref 0.61–1.24)
GFR calc Af Amer: >60 mL/min (ref >60)
GFR calc non Af Amer: >60 mL/min (ref >60)
Glucose, Bld: 140 mg/dL — ABNORMAL HIGH (ref 70–99)
Potassium: 2.7 mmol/L — CL (ref 3.5–5.1)
Sodium: 123 mmol/L — ABNORMAL LOW (ref 135–145)

## 2019-02-11 LAB — SARS CORONAVIRUS 2 (TAT 6-24 HRS): SARS Coronavirus 2: NEGATIVE

## 2019-02-11 LAB — LACTIC ACID, PLASMA: Lactic Acid, Venous: 1.3 mmol/L (ref 0.5–1.9)

## 2019-02-11 MED ORDER — GABAPENTIN 300 MG PO CAPS
600.0000 mg | ORAL_CAPSULE | Freq: Two times a day (BID) | ORAL | Status: DC
  Administered 2019-02-11 – 2019-02-13 (×6): 600 mg via ORAL
  Filled 2019-02-11 (×6): qty 2

## 2019-02-11 MED ORDER — ACETAMINOPHEN 650 MG RE SUPP: 650.0000 mg | Freq: Four times a day (QID) | RECTAL | Status: DC | PRN

## 2019-02-11 MED ORDER — ACETAMINOPHEN 325 MG PO TABS
650.0000 mg | ORAL_TABLET | Freq: Four times a day (QID) | ORAL | Status: DC | PRN
  Administered 2019-02-12: 650 mg via ORAL
  Filled 2019-02-11: qty 2

## 2019-02-11 MED ORDER — FLUTICASONE PROPIONATE 50 MCG/ACT NA SUSP
1.0000 | Freq: Every day | NASAL | Status: DC
  Administered 2019-02-11 – 2019-02-13 (×3): 1 via NASAL
  Filled 2019-02-11: qty 16

## 2019-02-11 MED ORDER — CHLORTHALIDONE 25 MG PO TABS
25.0000 mg | ORAL_TABLET | Freq: Every day | ORAL | Status: DC
  Administered 2019-02-11: 25 mg via ORAL
  Filled 2019-02-11: qty 1

## 2019-02-11 MED ORDER — POTASSIUM CHLORIDE CRYS ER 20 MEQ PO TBCR
40.0000 meq | EXTENDED_RELEASE_TABLET | ORAL | Status: AC
  Administered 2019-02-11 (×2): 40 meq via ORAL
  Filled 2019-02-11 (×2): qty 2

## 2019-02-11 MED ORDER — SODIUM CHLORIDE 0.9 % IV SOLN
INTRAVENOUS | Status: AC
  Administered 2019-02-11 (×2): via INTRAVENOUS

## 2019-02-11 MED ORDER — ENOXAPARIN SODIUM 40 MG/0.4ML ~~LOC~~ SOLN: 40.0000 mg | SUBCUTANEOUS | Status: DC

## 2019-02-11 MED ORDER — ATENOLOL-CHLORTHALIDONE 50-25 MG PO TABS: 1.0000 | ORAL_TABLET | Freq: Every day | ORAL | Status: DC

## 2019-02-11 MED ORDER — SODIUM CHLORIDE 0.9 % IV SOLN
2.0000 g | Freq: Every day | INTRAVENOUS | Status: DC
  Administered 2019-02-11: 2 g via INTRAVENOUS
  Filled 2019-02-11: qty 2

## 2019-02-11 MED ORDER — SENNA 8.6 MG PO TABS
1.0000 | ORAL_TABLET | Freq: Two times a day (BID) | ORAL | Status: DC
  Administered 2019-02-11 – 2019-02-12 (×5): 8.6 mg via ORAL
  Filled 2019-02-11 (×5): qty 1

## 2019-02-11 MED ORDER — KETOROLAC TROMETHAMINE 30 MG/ML IJ SOLN
30.0000 mg | Freq: Four times a day (QID) | INTRAMUSCULAR | Status: DC | PRN
  Administered 2019-02-11 – 2019-02-12 (×4): 30 mg via INTRAVENOUS
  Filled 2019-02-11 (×5): qty 1

## 2019-02-11 MED ORDER — DULOXETINE HCL 30 MG PO CPEP
30.0000 mg | ORAL_CAPSULE | Freq: Three times a day (TID) | ORAL | Status: DC
  Administered 2019-02-11 – 2019-02-13 (×7): 30 mg via ORAL
  Filled 2019-02-11 (×7): qty 1

## 2019-02-11 MED ORDER — ATENOLOL 25 MG PO TABS
50.0000 mg | ORAL_TABLET | Freq: Every day | ORAL | Status: DC
  Administered 2019-02-11 – 2019-02-13 (×3): 50 mg via ORAL
  Filled 2019-02-11 (×3): qty 2

## 2019-02-11 MED ORDER — ENOXAPARIN SODIUM 150 MG/ML ~~LOC~~ SOLN
145.0000 mg | Freq: Two times a day (BID) | SUBCUTANEOUS | Status: DC
  Administered 2019-02-11 (×2): 145 mg via SUBCUTANEOUS
  Filled 2019-02-11 (×3): qty 0.97

## 2019-02-11 MED ORDER — VITAMIN B-12 1000 MCG PO TABS
1000.0000 ug | ORAL_TABLET | Freq: Every day | ORAL | Status: DC
  Administered 2019-02-11 – 2019-02-13 (×3): 1000 ug via ORAL
  Filled 2019-02-11 (×3): qty 1

## 2019-02-11 MED ORDER — CLINDAMYCIN PHOSPHATE 600 MG/50ML IV SOLN
600.0000 mg | Freq: Three times a day (TID) | INTRAVENOUS | Status: DC
  Administered 2019-02-11 – 2019-02-13 (×6): 600 mg via INTRAVENOUS
  Filled 2019-02-11 (×7): qty 50

## 2019-02-11 MED ORDER — RISAQUAD PO CAPS
1.0000 | ORAL_CAPSULE | Freq: Every day | ORAL | Status: DC
  Administered 2019-02-11 – 2019-02-13 (×3): 1 via ORAL
  Filled 2019-02-11 (×3): qty 1

## 2019-02-11 MED ORDER — PANTOPRAZOLE SODIUM 40 MG PO TBEC
40.0000 mg | DELAYED_RELEASE_TABLET | Freq: Every day | ORAL | Status: DC
  Administered 2019-02-11 – 2019-02-13 (×3): 40 mg via ORAL
  Filled 2019-02-11 (×3): qty 1

## 2019-02-11 MED ORDER — CHLORHEXIDINE GLUCONATE CLOTH 2 % EX PADS
6.0000 | MEDICATED_PAD | Freq: Every day | CUTANEOUS | Status: DC
  Administered 2019-02-11 – 2019-02-12 (×2): 6 via TOPICAL

## 2019-02-11 MED ORDER — LEVOTHYROXINE SODIUM 137 MCG PO TABS
137.0000 ug | ORAL_TABLET | Freq: Every day | ORAL | Status: DC
  Administered 2019-02-11 – 2019-02-13 (×3): 137 ug via ORAL
  Filled 2019-02-11 (×3): qty 1

## 2019-02-11 NOTE — Progress Notes (Signed)
Echo will be performed 1st thing tmrw morning, per conversation w/ nurse

## 2019-02-11 NOTE — Progress Notes (Signed)
PROGRESS NOTE  Mark Good  DOB: 07-Oct-1965  PCP: Marinda Elk, MD HBZ:[id number]  DOA: 02/10/2019  LOS: 0 days   Chief Complaint  Patient presents with  . Leg Pain   Brief narrative: Mark Good is a 53 y.o. male with medical history significant of  history of hypertension, sleep apnea, depression. Patient presented to the ED on 12/3 with complaint of rapidly worsening left leg swelling, redness and pain within 24 hours.  At first went to urgent care and was subsequently sent to ED to rule out DVT.  Denies any fall, local trauma, animal/insect bite in the area.  Few hours prior to presentation in the ED, patient noticed swelling, redness and burning pain in the left leg with streaks of redness after his lower thigh.    In the ER, patient was hemodynamically stable. On examination, he was found to have red, swollen left leg with red streaks to the upper thigh. Work-up showed mild elevation in WBC 11.9, sodium level low at 122, lactic acid elevated to 2.1. Patient was admitted to hospitalist medicine service for further evaluation and management.  Subjective: Patient was seen and examined this morning.  Lying down in bed.  Not in distress.  No new symptoms.  Assessment/Plan: Acute left leg cellulitis with lymphangitis proximally. -Area demarcated with marker. -No open wound.  On admission, patient was started on IV Rocephin. I will switch to IV clindamycin. -Monitor clinically. -Ultrasound duplex rule out DVT.  Acute hyponatremia -Sodium level low at 122. -No history of alcohol use. Likely because of chlorthalidone -Currently on normal/75 mL/h. Repeat sodium level this afternoon is 123 only.  Continue saline. Repeat blood work Advertising account executive.  Hypokalemia  - K level low at 2.8. Kcl oral 40 mEq X 2 given. Repeat tomorrow.  HTN -Home meds include atenolol/chlorthalidone -Resume atenolol.  Keep chlorthalidone on hold.  Monitor blood pressure.  Diabetes mellitus -Home meds  include Metformin, Neurontin -Keep Metformin on hold.  Resume Neurontin.  Hypothyroidism -Resume Synthroid  GERD -Resume Protonix  Depression -Resume Cymbalta  Morbid obesity - Body mass index is 41.33 kg/m. Patient has been advised to make an attempt to improve diet and exercise patterns to aid in weight loss.  OSA -CPAP recommended  Mobility: Encourage ambulation Diet: Cardiac diet Fluid: Normal saline at 75 mils per hour DVT prophylaxis:  Lovenox subcu Code Status:  Full code Family Communication:  Discussed with wife Expected Discharge:  Hopefully home in next 2 to 3 days.  Consultants:    Procedures:    Antimicrobials: Anti-infectives (From admission, onward)   Start     Dose/Rate Route Frequency Ordered Stop   02/11/19 1400  clindamycin (CLEOCIN) IVPB 600 mg     600 mg 100 mL/hr over 30 Minutes Intravenous Every 8 hours 02/11/19 1314     02/11/19 0300  cefTRIAXone (ROCEPHIN) 2 g in sodium chloride 0.9 % 100 mL IVPB  Status:  Discontinued     2 g 200 mL/hr over 30 Minutes Intravenous Daily 02/11/19 0229 02/11/19 1323   02/10/19 2230  doxycycline (VIBRA-TABS) tablet 100 mg     100 mg Oral  Once 02/10/19 2223 02/10/19 2256   02/10/19 2230  vancomycin (VANCOCIN) IVPB 1000 mg/200 mL premix     1,000 mg 200 mL/hr over 60 Minutes Intravenous  Once 02/10/19 2229 02/11/19 0006        Code Status: Full Code   Diet Order            Diet Carb  Modified Fluid consistency: Thin; Room service appropriate? Yes  Diet effective now              Infusions:  . sodium chloride 75 mL/hr at 02/11/19 1119  . clindamycin (CLEOCIN) IV 600 mg (02/11/19 1406)    Scheduled Meds: . acidophilus  1 capsule Oral Daily  . atenolol  50 mg Oral Daily  . Chlorhexidine Gluconate Cloth  6 each Topical Daily  . DULoxetine  30 mg Oral Q8H  . enoxaparin (LOVENOX) injection  145 mg Subcutaneous Q12H  . fluticasone  1 spray Each Nare Daily  . gabapentin  600 mg Oral BID  .  levothyroxine  137 mcg Oral Q0600  . pantoprazole  40 mg Oral Daily  . potassium chloride  40 mEq Oral Q2H  . senna  1 tablet Oral BID  . vitamin B-12  1,000 mcg Oral Daily    PRN meds: acetaminophen **OR** acetaminophen, ketorolac   Objective: Vitals:   02/11/19 1043 02/11/19 1418  BP: 138/88 129/78  Pulse: 68 79  Resp:  20  Temp:  98.6 F (37 C)  SpO2:  98%    Intake/Output Summary (Last 24 hours) at 02/11/2019 1647 Last data filed at 02/11/2019 1505 Gross per 24 hour  Intake 3948 ml  Output -  Net 3948 ml   Filed Weights   02/11/19 0500  Weight: (!) 146 kg   Weight change:  Body mass index is 41.33 kg/m.   Physical Exam: General exam: Appears calm and comfortable.  Skin: No rashes, lesions or ulcers. HEENT: Atraumatic, normocephalic, supple neck, no obvious bleeding Lungs: Clear to auscultate bilaterally CVS: Regular rate and rhythm, no murmur GI/Abd soft, nontender, nondistended, bowel sound present CNS: Alert, awake, oriented x3 Psychiatry: Mood appropriate Extremities: Left leg with cellulitis posteriorly in the calf extending from ankle up to knee and lymphangitis streak extending to the lower thigh.  Right lower extremity normal.  Data Review: I have personally reviewed the laboratory data and studies available.  Recent Labs  Lab 02/10/19 2031 02/11/19 1330  WBC 11.9* 8.3  NEUTROABS 9.9* 6.9  HGB 14.7 12.5*  HCT 43.2 36.2*  MCV 88.9 87.2  PLT 257 192   Recent Labs  Lab 02/10/19 2031 02/11/19 1330  NA 122* 123*  K 4.0 2.7*  CL 85* 88*  CO2 24 21*  GLUCOSE 121* 140*  BUN 15 17  CREATININE 1.09 0.93  CALCIUM 9.5 8.8*    Terrilee Croak, MD  [facility] 02/11/2019

## 2019-02-11 NOTE — Progress Notes (Signed)
CRITICAL VALUE ALERT  Critical Value:  K 2.7  Date & Time Notied:  02/11/19  1410  MD had already ordered po potassium.

## 2019-02-11 NOTE — Progress Notes (Signed)
Left lower extremity venous duplex has been completed. Preliminary results can be found in CV Proc through chart review.   02/11/19 8:50 AM Mark Good RVT

## 2019-02-11 NOTE — ED Notes (Signed)
Report given to Barbaraann Boys and Tom,RN at [facility]

## 2019-02-11 NOTE — Progress Notes (Deleted)
Will attempt echo 1st thing, per conversation w/ nurse in afternoon

## 2019-02-11 NOTE — ED Notes (Signed)
[facility] arrived to transport pt to [facility] 

## 2019-02-11 NOTE — Progress Notes (Signed)
Checked on patient with regards to wearing CPAP.  Patient states that he would like to place himself on machine when he is ready.  Check water level in chamber and refilled per patient request.  Reminded patient that I will be available if assistance is needed.

## 2019-02-11 NOTE — H&P (Signed)
History and Physical    Mark Good DXA:[id number] DOB: 09-May-1965 DOA: 02/10/2019  PCP: Marinda Elk, MD (Confirm with patient/family/NH records and if not entered, this has to be entered at [facility] point of entry) Patient coming from: home  I have personally briefly reviewed patient's old medical records in [facility]  Chief Complaint: swollen, red leg  HPI: Mark Good is a 53 y.o. male with medical history significant of  history of hypertension, sleep apnea, depression here with leg pain or leg swelling.  Urgent care sent him to [facility] ED for concern for DVT.  States he injured his groin yesterday when he was trying to get out of a stool but did not fall or hit his head.  He then noticed there is some burning and redness and pain to his left leg with streaks up to his lower leg to his thigh.  It is warm and red.  He denies fever, vomiting.  No chest pain or shortness of breath.   No history of blood clot.  No focal weakness, numbness or tingling.  No pain with urination or blood in the urine.  ED Course: Hemodynamically stable. Exam revealed red, swollen left leg with red streaks to the upper thigh. Lab reveals mild leukocytosis, hyponatremia at 122. Due to hyponatremia, probable cellulitis left leg and need to r/o DVT he is transferred to [facility] for further care and management.   Review of Systems: As per HPI otherwise 10 point review of systems negative.    Past Medical History:  Diagnosis Date  . Anxiety   . Depression   . GERD (gastroesophageal reflux disease)   . Headache(784.0)   . Hypertension   . Hypothyroidism   . Sleep apnea    uses cpap    Past Surgical History:  Procedure Laterality Date  . CARDIAC CATHETERIZATION     Dr Tresa Endo  . KNEE ARTHROSCOPY Right   . left leg repair of laceration    . LUMBAR FUSION  09/23/2012   Dr Shon Baton  . NASAL SEPTOPLASTY W/ TURBINOPLASTY     Soc Hx  - married 30+ years. One daughter, 1 son. Works Corporate treasurer. Lives with  wife - marriage in good health.   reports that he has never smoked. He has never used smokeless tobacco. He reports current alcohol use. He reports that he does not use drugs.  Allergies  Allergen Reactions  . Bupropion Other (See Comments)    Headaches  . Codeine Itching and Rash    Family History  Problem Relation Age of Onset  . Fibromyalgia Mother   . Other Mother        back and knee issues  . Fibromyalgia Father   . Other Brother   . Other Brother   . Other Brother    Unacceptable: Noncontributory, unremarkable, or negative. Acceptable: Family history reviewed and not pertinent (If you reviewed it)  Prior to Admission medications   Medication Sig Start Date End Date Taking? Authorizing Provider  Aspirin-Acetaminophen-Caffeine (GOODYS EXTRA STRENGTH PO) Take 1 packet by mouth daily as needed (pain).    Yes [provider]  atenolol-chlorthalidone (TENORETIC) 50-25 MG per tablet Take 1 tablet by mouth daily.   Yes [provider]  Cholecalciferol (VITAMIN D3) 2000 UNITS TABS Take 1 tablet by mouth daily.   Yes [provider]  DULoxetine (CYMBALTA) 30 MG capsule Take 3 times daily. Please discontinue all previous prescriptions. 04/07/17  Yes Thresa Ross, MD  fluticasone (FLONASE) 50  MCG/ACT nasal spray Place 1 spray into both nostrils daily as needed for allergies or rhinitis.  10/27/14  Yes [provider]  gabapentin (NEURONTIN) 600 MG tablet Take 600 mg by mouth 2 (two) times daily. Reported on 05/10/2015   Yes [provider]  levothyroxine (SYNTHROID) 137 MCG tablet Take 137 mcg by mouth daily before breakfast.   Yes [provider]  metFORMIN (GLUCOPHAGE) 500 MG tablet Take 500 mg by mouth 2 (two) times daily with a meal.    Yes [provider]  pantoprazole (PROTONIX) 40 MG tablet Take 40 mg by mouth daily.   Yes [provider]  vitamin B-12 (CYANOCOBALAMIN) 1000 MCG tablet Take by mouth.   Yes  [provider]  lamoTRIgine (LAMICTAL) 100 MG tablet Take 1 tablet (100 mg total) by mouth daily. Patient not taking: Reported on 04/07/2017 12/09/16 04/07/17  Merian Capron, MD    Physical Exam: Vitals:   02/10/19 2028 02/11/19 0053 02/11/19 0217  BP: (!) 150/98 (!) 143/79 (!) 150/101  Pulse: 85 73 80  Resp: 18 19 18   Temp: 98.7 F (37.1 C)  99.3 F (37.4 C)  TempSrc: Oral  Oral  SpO2: 100% 98% 99%    Constitutional: NAD, calm, comfortable Vitals:   02/10/19 2028 02/11/19 0053 02/11/19 0217  BP: (!) 150/98 (!) 143/79 (!) 150/101  Pulse: 85 73 80  Resp: 18 19 18   Temp: 98.7 F (37.1 C)  99.3 F (37.4 C)  TempSrc: Oral  Oral  SpO2: 100% 98% 99%   General:  Obese man in no distress. Chronically plethoric Eyes: PERRL, lids and conjunctivae normal ENMT: Mucous membranes are moist. Posterior pharynx clear of any exudate or lesions.Normal dentition.  Neck: normal, supple, no masses, no thyromegaly Respiratory: clear to auscultation bilaterally, no wheezing, no crackles. Normal respiratory effort. No accessory muscle use.  Cardiovascular: Regular rate and rhythm, no murmurs / rubs / gallops. No extremity edema. 2+ pedal pulses. No carotid bruits.  Abdomen: obese, no tenderness, no masses palpated. No hepatosplenomegaly. Bowel sounds positive.  Musculoskeletal: no clubbing / cyanosis. No joint deformity upper and lower extremities. Good ROM, no contractures. Normal muscle tone. Mild swelling left distal LE - approximately 2 cm greater circumference than right.  Skin: no rashes, lesions, ulcers. Distal left LE indurated, erythematous, warm to the touch. No open wound. Erythema extends to medial leg and thigh. No adenopathy left groin Neurologic: CN 2-12 grossly intact. Sensation intact. Strength 5/5 in all 4.  Psychiatric: Normal judgment and insight. Alert and oriented x 3. Normal mood.   (Anything < 9 systems with 2 bullets each down codes to level 1) (If patient refuses  exam can't bill higher level) (Make sure to document decubitus ulcers present on admission -- if possible -- and whether patient has chronic indwelling catheter at time of admission)  Labs on Admission: I have personally reviewed following labs and imaging studies  CBC: Recent Labs  Lab 02/10/19 2031  WBC 11.9*  NEUTROABS 9.9*  HGB 14.7  HCT 43.2  MCV 88.9  PLT 409   Basic Metabolic Panel: Recent Labs  Lab 02/10/19 2031  NA 122*  K 4.0  CL 85*  CO2 24  GLUCOSE 121*  BUN 15  CREATININE 1.09  CALCIUM 9.5   GFR: CrCl cannot be calculated (Unknown ideal weight.). Liver Function Tests: Recent Labs  Lab 02/10/19 2031  AST 23  ALT 28  ALKPHOS 71  BILITOT 1.2  PROT 8.6*  ALBUMIN 4.4   No  results for input(s): LIPASE, AMYLASE in the last 168 hours. No results for input(s): AMMONIA in the last 168 hours. Coagulation Profile: No results for input(s): INR, PROTIME in the last 168 hours. Cardiac Enzymes: No results for input(s): CKTOTAL, CKMB, CKMBINDEX, TROPONINI in the last 168 hours. BNP (last 3 results) No results for input(s): PROBNP in the last 8760 hours. HbA1C: No results for input(s): HGBA1C in the last 72 hours. CBG: No results for input(s): GLUCAP in the last 168 hours. Lipid Profile: No results for input(s): CHOL, HDL, LDLCALC, TRIG, CHOLHDL, LDLDIRECT in the last 72 hours. Thyroid Function Tests: No results for input(s): TSH, T4TOTAL, FREET4, T3FREE, THYROIDAB in the last 72 hours. Anemia Panel: No results for input(s): VITAMINB12, FOLATE, FERRITIN, TIBC, IRON, RETICCTPCT in the last 72 hours. Urine analysis: No results found for: COLORURINE, APPEARANCEUR, LABSPEC, PHURINE, GLUCOSEU, HGBUR, BILIRUBINUR, KETONESUR, PROTEINUR, UROBILINOGEN, NITRITE, LEUKOCYTESUR  Radiological Exams on Admission: Dg Tibia/fibula Left  Result Date: 02/10/2019 CLINICAL DATA:  Left leg pain redness and swelling EXAM: LEFT TIBIA AND FIBULA - 2 VIEW COMPARISON:  None.  FINDINGS: There is no evidence of fracture or other focal bone lesions. Mild soft tissue swelling seen around the lower extremity. Soft tissue calcifications seen posterior to the knee. IMPRESSION: No acute osseous abnormality. Electronically Signed   By: Jonna ClarkBindu  Avutu M.D.   On: 02/10/2019 22:36    EKG: Independently reviewed. No EKG on chart  Assessment/Plan Active Problems:   Hyponatremia   Cellulitis of left leg   Essential (primary) hypertension   Adult hypothyroidism   Obstructive apnea  (please populate well all problems here in Problem List. (For example, if patient is on BP meds at home and you resume or decide to hold them, it is a problem that needs to be her. Same for CAD, COPD, HLD and so on)   1. Hyponatremia - mild. Patient admits to large free water intake and low salt diet. Plan IVF - NS 75 ml/hr to replenish Na  Bmet 12/6 AM  2. DVT - enlarged left calve. Tender to palpation but negative Homan's. Risk factor - prolong sitting.  Plan Therapeutic Lovenox until DVT ruled out  LE doppler today  3. Cellulitis - left leg red, warm, swollen, burning. No open wound. Mild leukocytosis at 11.9 with normal diff.Diabetic patient. Has received dose of Vanco. Plan Rocephin 2g/24 hr IV  Can switch to oral abx., e.g. ceftin, at discharge  4. HTN- continue home meds  5. Hypothyroidism - [facility] a [facility] basis Plan Continue home meds  6. OSA - no distress. May forego home treatment during short hospitalization  DVT prophylaxis: lovenox at therapeutic dose (Lovenox/Heparin/SCD's/anticoagulated/None (if comfort care) Code Status: full code (Full/Partial (specify details) Family Communication: deferred calling wife due to late hour (Specify name, relationship. Do not write "discussed with patient". Specify tel # if discussed over the phone) Disposition Plan: home 24-48 hrs (specify when and where you expect patient to be discharged) Consults called: none (with names)  Admission status: obs (inpatient / obs / tele / medical floor / SDU)   Illene RegulusMichael Lillie Bollig MD [facility] Pager [phone]  If 7PM-7AM, please contact night-coverage [email or web address] Password [email or web address]  02/11/2019, 2:34 AM

## 2019-02-11 NOTE — Progress Notes (Signed)
ANTICOAGULATION CONSULT NOTE - Initial Consult  Pharmacy Consult for Enoxaparin Indication: DVT  Allergies  Allergen Reactions  . Bupropion Other (See Comments)    Headaches  . Codeine Itching and Rash    Patient Measurements: Height: 6\' 2"  (188 cm) Weight: (!) 321 lb 14 oz (146 kg) IBW/kg (Calculated) : 82.2 Heparin Dosing Weight:   Vital Signs: Temp: 98.8 F (37.1 C) (12/04 0514) Temp Source: Oral (12/04 0514) BP: 118/74 (12/04 0514) Pulse Rate: 65 (12/04 0514)  Labs: Recent Labs    02/10/19 2031  HGB 14.7  HCT 43.2  PLT 257  CREATININE 1.09    Estimated Creatinine Clearance: 120.8 mL/min (by C-G formula based on SCr of 1.09 mg/dL).   Medical History: Past Medical History:  Diagnosis Date  . Anxiety   . Depression   . GERD (gastroesophageal reflux disease)   . Headache(784.0)   . Hypertension   . Hypothyroidism   . Sleep apnea    uses cpap    Medications:  Scheduled:  . atenolol  50 mg Oral Daily  . chlorthalidone  25 mg Oral Daily  . DULoxetine  30 mg Oral Q8H  . enoxaparin (LOVENOX) injection  145 mg Subcutaneous Q12H  . fluticasone  1 spray Each Nare Daily  . gabapentin  600 mg Oral BID  . levothyroxine  137 mcg Oral Q0600  . pantoprazole  40 mg Oral Daily  . senna  1 tablet Oral BID  . vitamin B-12  1,000 mcg Oral Daily    Assessment: Patient with possible DVT.  First dose enoxaparin already given in ED.  Goal of Therapy:  Heparin level 0.3-0.7 units/ml Anti-Xa level 0.6-1 units/ml 4hrs after LMWH dose given Monitor platelets by anticoagulation protocol: Yes   Plan:  Enoxaparin 145mg  sq 12hr CBC q3days  Nani Skillern Crowford 02/11/2019,5:33 AM

## 2019-02-12 ENCOUNTER — Inpatient Hospital Stay (HOSPITAL_COMMUNITY): Payer: BC Managed Care – PPO

## 2019-02-12 DIAGNOSIS — I361 Nonrheumatic tricuspid (valve) insufficiency: Secondary | ICD-10-CM

## 2019-02-12 LAB — CBC
HCT: 35.2 % — ABNORMAL LOW (ref 39.0–52.0)
Hemoglobin: 11.9 g/dL — ABNORMAL LOW (ref 13.0–17.0)
MCH: 30.1 pg (ref 26.0–34.0)
MCHC: 33.8 g/dL (ref 30.0–36.0)
MCV: 89.1 fL (ref 80.0–100.0)
Platelets: 192 10*3/uL (ref 150–400)
RBC: 3.95 MIL/uL — ABNORMAL LOW (ref 4.22–5.81)
RDW: 12.7 % (ref 11.5–15.5)
WBC: 6.1 10*3/uL (ref 4.0–10.5)
nRBC: 0 % (ref 0.0–0.2)

## 2019-02-12 LAB — BASIC METABOLIC PANEL
Anion gap: 12 (ref 5–15)
BUN: 12 mg/dL (ref 6–20)
CO2: 21 mmol/L — ABNORMAL LOW (ref 22–32)
Calcium: 8.3 mg/dL — ABNORMAL LOW (ref 8.9–10.3)
Chloride: 91 mmol/L — ABNORMAL LOW (ref 98–111)
Creatinine, Ser: 0.83 mg/dL (ref 0.61–1.24)
GFR calc Af Amer: >60 mL/min (ref >60)
GFR calc non Af Amer: >60 mL/min (ref >60)
Glucose, Bld: 127 mg/dL — ABNORMAL HIGH (ref 70–99)
Potassium: 3.3 mmol/L — ABNORMAL LOW (ref 3.5–5.1)
Sodium: 124 mmol/L — ABNORMAL LOW (ref 135–145)

## 2019-02-12 LAB — ECHOCARDIOGRAM COMPLETE
Height: 74 in
Weight: 5149.95 oz

## 2019-02-12 LAB — TSH: TSH: 4.131 u[IU]/mL (ref 0.350–4.500)

## 2019-02-12 LAB — HIV ANTIBODY (ROUTINE TESTING W REFLEX): HIV Screen 4th Generation wRfx: NONREACTIVE

## 2019-02-12 MED ORDER — SODIUM CHLORIDE 1 G PO TABS
1.0000 g | ORAL_TABLET | Freq: Two times a day (BID) | ORAL | Status: DC
  Administered 2019-02-12 – 2019-02-13 (×3): 1 g via ORAL
  Filled 2019-02-12 (×3): qty 1

## 2019-02-12 MED ORDER — ENOXAPARIN SODIUM 80 MG/0.8ML ~~LOC~~ SOLN
75.0000 mg | Freq: Every day | SUBCUTANEOUS | Status: DC
  Filled 2019-02-12: qty 0.8

## 2019-02-12 MED ORDER — POTASSIUM CHLORIDE CRYS ER 20 MEQ PO TBCR
40.0000 meq | EXTENDED_RELEASE_TABLET | Freq: Once | ORAL | Status: AC
  Administered 2019-02-12: 10:00:00 40 meq via ORAL
  Filled 2019-02-12: qty 2

## 2019-02-12 NOTE — Progress Notes (Signed)
Pt. Stated he did well with CPAP last night and will again place himself on when he is ready for bed.  Water chamber filled.  Will be available if patient needs assistance.

## 2019-02-12 NOTE — Progress Notes (Addendum)
PROGRESS NOTE  Mark Good  DOB: Mar 30, 1965  PCP: Marinda Elk, MD XIP:[id number]  DOA: 02/10/2019  LOS: 1 day   Chief Complaint  Patient presents with  . Leg Pain   Brief narrative: Mark Good is a 53 y.o. male with medical history significant of  history of hypertension, sleep apnea, depression. Patient presented to the ED on 12/3 with complaint of rapidly worsening left leg swelling, redness and pain within 24 hours.  At first went to urgent care and was subsequently sent to ED to rule out DVT.  Denies any fall, local trauma, animal/insect bite in the area.  Few hours prior to presentation in the ED, patient noticed swelling, redness and burning pain in the left leg with streaks of redness after his lower thigh.    In the ER, patient was hemodynamically stable. On examination, he was found to have red, swollen left leg with red streaks to the upper thigh. Work-up showed mild elevation in WBC 11.9, sodium level low at 122, lactic acid elevated to 2.1. Patient was admitted to hospitalist medicine service for further evaluation and management.  Subjective: Patient was seen and examined this morning.  Lying down in bed.  Not in distress.  No new symptoms. Left leg cellulitis area still seems red, swollen.  Less painful than yesterday per patient.  Assessment/Plan: Acute left leg cellulitis with lymphangitis proximally. -Area demarcated with marker. -No open wound.  On IV clindamycin.  -DVT ruled out.  Continue to monitor clinically.  Acute hyponatremia -Sodium level was low at 122 on arrival.  IV hydration started, chlorthalidone stopped.  Level improved to 124 today.  -Also started the patient on salt tablets today. -Repeat blood work Advertising account executive.  Hypokalemia  -Potassium low at 3.3 today.  Replacement given.  HTN -Home meds include atenolol/chlorthalidone -Resume atenolol.  Keep chlorthalidone on hold.  Monitor blood pressure.  Diabetes mellitus -Home meds include  Metformin, Neurontin -Metformin remains on hold.  Continue Neurontin.  Hypothyroidism -Resume Synthroid  GERD -Resume Protonix  Depression -Resume Cymbalta  Morbid obesity - Body mass index is 40.23 kg/m. Patient has been advised to make an attempt to improve diet and exercise patterns to aid in weight loss.  OSA -CPAP recommended  Mobility: Encourage ambulation Diet: Cardiac diet Fluid: Continue normal saline at 75 mils per hour DVT prophylaxis:  Lovenox subcu Code Status:  Full code Family Communication:  Discussed with wife Expected Discharge:  Hopefully home in next 2 to 3 days.  Consultants:    Procedures:    Antimicrobials: Anti-infectives (From admission, onward)   Start     Dose/Rate Route Frequency Ordered Stop   02/11/19 1400  clindamycin (CLEOCIN) IVPB 600 mg     600 mg 100 mL/hr over 30 Minutes Intravenous Every 8 hours 02/11/19 1314     02/11/19 0300  cefTRIAXone (ROCEPHIN) 2 g in sodium chloride 0.9 % 100 mL IVPB  Status:  Discontinued     2 g 200 mL/hr over 30 Minutes Intravenous Daily 02/11/19 0229 02/11/19 1323   02/10/19 2230  doxycycline (VIBRA-TABS) tablet 100 mg     100 mg Oral  Once 02/10/19 2223 02/10/19 2256   02/10/19 2230  vancomycin (VANCOCIN) IVPB 1000 mg/200 mL premix     1,000 mg 200 mL/hr over 60 Minutes Intravenous  Once 02/10/19 2229 02/11/19 0006        Code Status: Full Code   Diet Order            Diet Carb  Modified Fluid consistency: Thin; Room service appropriate? Yes  Diet effective now              Infusions:  . clindamycin (CLEOCIN) IV 600 mg (02/12/19 1347)    Scheduled Meds: . acidophilus  1 capsule Oral Daily  . atenolol  50 mg Oral Daily  . Chlorhexidine Gluconate Cloth  6 each Topical Daily  . DULoxetine  30 mg Oral Q8H  . enoxaparin (LOVENOX) injection  75 mg Subcutaneous QHS  . fluticasone  1 spray Each Nare Daily  . gabapentin  600 mg Oral BID  . levothyroxine  137 mcg Oral Q0600  . pantoprazole   40 mg Oral Daily  . senna  1 tablet Oral BID  . sodium chloride  1 g Oral BID WC  . vitamin B-12  1,000 mcg Oral Daily    PRN meds: acetaminophen **OR** acetaminophen, ketorolac   Objective: Vitals:   02/12/19 0951 02/12/19 1350  BP: 140/89 136/81  Pulse: 60 70  Resp:  20  Temp:  98.2 F (36.8 C)  SpO2:  99%    Intake/Output Summary (Last 24 hours) at 02/12/2019 1627 Last data filed at 02/12/2019 1536 Gross per 24 hour  Intake 1551.24 ml  Output -  Net 1551.24 ml   Filed Weights   02/11/19 0500  Weight: (!) 146 kg   Weight change:  Body mass index is 40.23 kg/m.   Physical Exam: General exam: Appears calm and comfortable.  Skin: No rashes, lesions or ulcers. HEENT: Atraumatic, normocephalic, supple neck, no obvious bleeding Lungs: Clear to auscultate bilaterally CVS: Regular rate and rhythm, no murmur GI/Abd soft, nontender, nondistended, bowel sound present CNS: Alert, awake, oriented x3 Psychiatry: Mood appropriate Extremities: Left leg with cellulitis posteriorly in the calf extending from ankle up to knee and lymphangitis streak extending to the lower thigh.  No improvement in redness and swelling noted today.  Tenderness somewhat improved.  Data Review: I have personally reviewed the laboratory data and studies available.  Recent Labs  Lab 02/10/19 2031 02/11/19 1330 02/12/19 0546  WBC 11.9* 8.3 6.1  NEUTROABS 9.9* 6.9  --   HGB 14.7 12.5* 11.9*  HCT 43.2 36.2* 35.2*  MCV 88.9 87.2 89.1  PLT 257 192 192   Recent Labs  Lab 02/10/19 2031 02/11/19 1330 02/12/19 0546  NA 122* 123* 124*  K 4.0 2.7* 3.3*  CL 85* 88* 91*  CO2 24 21* 21*  GLUCOSE 121* 140* 127*  BUN 15 17 12   CREATININE 1.09 0.93 0.83  CALCIUM 9.5 8.8* 8.3*    Terrilee Croak, MD  [facility] 02/12/2019

## 2019-02-12 NOTE — Progress Notes (Signed)
  Echocardiogram 2D Echocardiogram has been performed.  Mark Good 02/12/2019, 8:24 AM

## 2019-02-13 LAB — BASIC METABOLIC PANEL
Anion gap: 12 (ref 5–15)
BUN: 9 mg/dL (ref 6–20)
CO2: 25 mmol/L (ref 22–32)
Calcium: 8.6 mg/dL — ABNORMAL LOW (ref 8.9–10.3)
Chloride: 94 mmol/L — ABNORMAL LOW (ref 98–111)
Creatinine, Ser: 1.01 mg/dL (ref 0.61–1.24)
GFR calc Af Amer: >60 mL/min (ref >60)
GFR calc non Af Amer: >60 mL/min (ref >60)
Glucose, Bld: 114 mg/dL — ABNORMAL HIGH (ref 70–99)
Potassium: 3.5 mmol/L (ref 3.5–5.1)
Sodium: 131 mmol/L — ABNORMAL LOW (ref 135–145)

## 2019-02-13 MED ORDER — CLINDAMYCIN HCL 300 MG PO CAPS: 600.0000 mg | ORAL_CAPSULE | Freq: Three times a day (TID) | ORAL | 0 refills | Status: AC

## 2019-02-13 MED ORDER — RISAQUAD PO CAPS: 1.0000 | ORAL_CAPSULE | Freq: Every day | ORAL | 0 refills | Status: AC

## 2019-02-13 MED ORDER — ATENOLOL 50 MG PO TABS: 50.0000 mg | ORAL_TABLET | Freq: Every day | ORAL | 0 refills | Status: AC

## 2019-02-13 NOTE — Discharge Summary (Addendum)
Physician Discharge Summary  ARGUS CARAHER WUJ:[id number] DOB: 05/06/1965 DOA: 02/10/2019  PCP: Marinda Elk, MD  Admit date: 02/10/2019 Discharge date: 02/13/2019  Admitted From: Home Discharge disposition: Home   Code Status: Full Code  Diet Recommendation: Diabetic diet   Recommendations for Outpatient Follow-Up:   1. Follow-up with PCP as outpatient on a week repeat sodium level.  Discharge Diagnosis:   Active Problems:   Essential (primary) hypertension   Adult hypothyroidism   Obstructive apnea   Hyponatremia   Cellulitis of left leg  History of Present Illness / Brief narrative:  Mark Good a 53 y.o.malewith medical history significant ofhistory of hypertension, sleep apnea, depression. Patient presented to the ED on 12/3 with complaint of rapidly worsening left leg swelling, redness and pain within 24 hours.  At first went to urgent care and was subsequently sent to ED to rule out DVT.  Denies any fall, local trauma, animal/insect bite in the area.  Few hours prior to presentation in the ED, patient noticed swelling, redness and burning pain in the left leg with streaks of redness after his lower thigh.    In the ER, patient was hemodynamically stable. On examination, he was found to have red, swollen left leg with red streaks to the upper thigh. Work-up showed mild elevation in WBC 11.9, sodium level low at 122, lactic acid elevated to 2.1. Patient was admitted to hospitalist medicine service for further evaluation and management.  Hospital Course:  Acute left leg cellulitis with lymphangitis proximally. -Area demarcated with marker.  Shrinking size, swelling, pain, tenderness.  In the center, it is getting more organized.  No fluctuation to suggest underlying fluid collection. No open wound.   -Currently on IV clindamycin.  Plan to discharge home on oral clindamycin for next 7 days to complete the course.  Probiotics along with. -DVT ruled out with  ultrasound duplex. -Tramadol as needed for limited supply given.  Acute hyponatremia -Sodium level was low at 122 on arrival.  IV hydration started, chlorthalidone stopped.    I also given salt tablets yesterday.  Sodium level is significantly improved to 131 today.   -At discharge today I recommended patient to stop chlorthalidone altogether.  If he starts getting pedal edema again, I recommended him to follow-up with primary care provider and probably start on low-dose Lasix.  Hypokalemia  -Level improved with replacement.  HTN -Home meds include atenolol/chlorthalidone -Resumed atenolol.  Stopped chlorthalidone.  Prescription for atenolol given at discharge  Diabetes mellitus -Home meds include Metformin, Neurontin -Metformin remains on hold.  Continue Neurontin.  Hypothyroidism -Resume Synthroid  GERD -Resume Protonix  Depression -Resume Cymbalta  Morbid obesity - Body mass index is 40.23 kg/m. Patient has been advised to make an attempt to improve diet and exercise patterns to aid in weight loss.  OSA -CPAP recommended  Stable for discharge to home today.  Subjective:  Seen and examined this morning.  Pleasant middle-aged Caucasian male.  Not in distress.  Leg pain improving.  Ready to go home today.  Discharge Exam:   Vitals:   02/12/19 1500 02/12/19 2057 02/13/19 0535 02/13/19 0950  BP:  127/68 138/86   Pulse:  63 (!) 57 76  Resp:  18 16   Temp:  98.4 F (36.9 C) 97.8 F (36.6 C)   TempSrc:  Oral Oral   SpO2:  98% 99%   Weight:      Height:  (1.905 m)       Body mass index  is 40.23 kg/m.  General exam: Appears calm and comfortable.  Skin: No rashes, lesions or ulcers. HEENT: Atraumatic, normocephalic, supple neck, no obvious bleeding Lungs: Clear to auscultation bilaterally CVS: Regular rate and rhythm, no murmur GI/Abd soft, nontender, nondistended, bowel sound present CNS: Alert, awake, oriented x3 Psychiatry: Mood  appropriate Extremities: No pedal edema, left leg cellulitis improving in size, redness, swelling and tenderness  Discharge Instructions:  Wound care: None Discharge Instructions    Diet Carb Modified   Complete by: As directed    Increase activity slowly   Complete by: As directed      Follow-up Information    Briscoe Deutscher, MD Follow up in 1 week(s).   Specialty: Family Medicine Contact information: [address] [place] [address] [phone]          Allergies as of 02/13/2019      Reactions   Bupropion Other (See Comments)   Headaches   Codeine Itching, Rash      Medication List    STOP taking these medications   atenolol-chlorthalidone 50-25 MG tablet Commonly known as: TENORETIC     TAKE these medications   acidophilus Caps capsule Take 1 capsule by mouth daily for 7 days. Start taking on: February 14, 2019   atenolol 50 MG tablet Commonly known as: TENORMIN Take 1 tablet (50 mg total) by mouth daily. Start taking on: February 14, 2019   clindamycin 300 MG capsule Commonly known as: CLEOCIN Take 2 capsules (600 mg total) by mouth 3 (three) times daily for 7 days.   DULoxetine 30 MG capsule Commonly known as: CYMBALTA Take 3 times daily. Please discontinue all previous prescriptions.   fluticasone 50 MCG/ACT nasal spray Commonly known as: FLONASE Place 1 spray into both nostrils daily as needed for allergies or rhinitis.   gabapentin 600 MG tablet Commonly known as: NEURONTIN Take 600 mg by mouth 2 (two) times daily. Reported on 05/10/2015   GOODYS EXTRA STRENGTH PO Take 1 packet by mouth daily as needed (pain).   levothyroxine 137 MCG tablet Commonly known as: SYNTHROID Take 137 mcg by mouth daily before breakfast.   metFORMIN 500 MG tablet Commonly known as: GLUCOPHAGE Take 500 mg by mouth 2 (two) times daily with a meal.   pantoprazole 40 MG tablet Commonly known as: PROTONIX Take 40 mg by mouth daily.   vitamin B-12 1000 MCG  tablet Commonly known as: CYANOCOBALAMIN Take by mouth.   Vitamin D3 50 MCG (2000 UT) Tabs Take 1 tablet by mouth daily.      Time coordinating discharge: 35 minutes  The results of significant diagnostics from this hospitalization (including imaging, microbiology, ancillary and laboratory) are listed below for reference.    Procedures and Diagnostic Studies:   Dg Tibia/fibula Left  Result Date: 02/10/2019 CLINICAL DATA:  Left leg pain redness and swelling EXAM: LEFT TIBIA AND FIBULA - 2 VIEW COMPARISON:  None. FINDINGS: There is no evidence of fracture or other focal bone lesions. Mild soft tissue swelling seen around the lower extremity. Soft tissue calcifications seen posterior to the knee. IMPRESSION: No acute osseous abnormality. Electronically Signed   By: Prudencio Pair M.D.   On: 02/10/2019 22:36   Vas [place] Lower Extremity Venous (dvt)  Result Date: 02/11/2019  Lower Venous Study Indications: Swelling.  Risk Factors: None identified. Limitations: Body habitus and poor ultrasound/tissue interface. Comparison Study: No prior studies. Performing Technologist: Oliver Hum RVT  Examination Guidelines: A complete evaluation includes B-mode imaging, spectral Doppler, color  Doppler, and power Doppler as needed of all accessible portions of each vessel. Bilateral testing is considered an integral part of a complete examination. Limited examinations for reoccurring indications may be performed as noted.  +-----+---------------+---------+-----------+----------+--------------+  RIGHT Compressibility Phasicity Spontaneity Properties Thrombus Aging  +-----+---------------+---------+-----------+----------+--------------+  CFV   Full            Yes       Yes                                    +-----+---------------+---------+-----------+----------+--------------+   +---------+---------------+---------+-----------+----------+--------------+  LEFT       Compressibility Phasicity Spontaneity Properties Thrombus Aging  +---------+---------------+---------+-----------+----------+--------------+  CFV       Full            Yes       Yes                                    +---------+---------------+---------+-----------+----------+--------------+  SFJ       Full                                                             +---------+---------------+---------+-----------+----------+--------------+  FV Prox   Full                                                             +---------+---------------+---------+-----------+----------+--------------+  FV Mid    Full                                                             +---------+---------------+---------+-----------+----------+--------------+  FV Distal Full                                                             +---------+---------------+---------+-----------+----------+--------------+  PFV       Full                                                             +---------+---------------+---------+-----------+----------+--------------+  POP       Full            Yes       Yes                                    +---------+---------------+---------+-----------+----------+--------------+  PTV  Full                                                             +---------+---------------+---------+-----------+----------+--------------+  PERO      Full                                                             +---------+---------------+---------+-----------+----------+--------------+     Summary: Right: No evidence of common femoral vein obstruction. Left: There is no evidence of deep vein thrombosis in the lower extremity. No cystic structure found in the popliteal fossa.  *See table(s) above for measurements and observations. Electronically signed by Waverly Ferrari MD on 02/11/2019 at 7:28:45 PM.    Final      Labs:   Basic Metabolic Panel: Recent Labs  Lab 02/10/19 2031 02/11/19 1330 02/12/19 0546  02/13/19 0607  NA 122* 123* 124* 131*  K 4.0 2.7* 3.3* 3.5  CL 85* 88* 91* 94*  CO2 24 21* 21* 25  GLUCOSE 121* 140* 127* 114*  BUN 15 17 12 9   CREATININE 1.09 0.93 0.83 1.01  CALCIUM 9.5 8.8* 8.3* 8.6*   GFR Estimated Creatinine Clearance: 132 mL/min (by C-G formula based on SCr of 1.01 mg/dL). Liver Function Tests: Recent Labs  Lab 02/10/19 2031  AST 23  ALT 28  ALKPHOS 71  BILITOT 1.2  PROT 8.6*  ALBUMIN 4.4   No results for input(s): LIPASE, AMYLASE in the last 168 hours. No results for input(s): AMMONIA in the last 168 hours. Coagulation profile No results for input(s): INR, PROTIME in the last 168 hours.  CBC: Recent Labs  Lab 02/10/19 2031 02/11/19 1330 02/12/19 0546  WBC 11.9* 8.3 6.1  NEUTROABS 9.9* 6.9  --   HGB 14.7 12.5* 11.9*  HCT 43.2 36.2* 35.2*  MCV 88.9 87.2 89.1  PLT 257 192 192   Cardiac Enzymes: No results for input(s): CKTOTAL, CKMB, CKMBINDEX, TROPONINI in the last 168 hours. BNP: Invalid input(s): POCBNP CBG: No results for input(s): GLUCAP in the last 168 hours. D-Dimer No results for input(s): DDIMER in the last 72 hours. Hgb A1c No results for input(s): HGBA1C in the last 72 hours. Lipid Profile No results for input(s): CHOL, HDL, LDLCALC, TRIG, CHOLHDL, LDLDIRECT in the last 72 hours. Thyroid function studies Recent Labs    02/12/19 0546  TSH 4.131   Anemia work up No results for input(s): VITAMINB12, FOLATE, FERRITIN, TIBC, IRON, RETICCTPCT in the last 72 hours. Microbiology Recent Results (from the past 240 hour(s))  SARS CORONAVIRUS 2 (TAT 6-24 HRS) Nasopharyngeal Nasopharyngeal Swab     Status: None   Collection Time: 02/10/19 10:30 PM   Specimen: Nasopharyngeal Swab  Result Value Ref Range Status   SARS Coronavirus 2 NEGATIVE NEGATIVE Final    Comment: (NOTE) SARS-CoV-2 target nucleic acids are NOT DETECTED. The SARS-CoV-2 RNA is generally detectable in upper and lower respiratory specimens during the acute phase  of infection. Negative results do not preclude SARS-CoV-2 infection, do not rule out co-infections with other pathogens, and should not be used as the sole basis for treatment or other patient management  decisions. Negative results must be combined with clinical observations, patient history, and epidemiological information. The expected result is Negative. Fact Sheet for Patients: HairSlick.no Fact Sheet for Healthcare Providers: quierodirigir.com This test is not yet approved or cleared by the [place] FDA and  has been authorized for detection and/or diagnosis of SARS-CoV-2 by FDA under an Emergency Use Authorization (EUA). This EUA will remain  in effect (meaning this test can be used) for the duration of the COVID-19 declaration under Section 56 4(b)(1) of the Act, 21 U.S.C. section 360bbb-3(b)(1), unless the authorization is terminated or revoked sooner. Performed at [facility], [address], [place], [place] [address]   Blood culture (routine x 2)     Status: None (Preliminary result)   Collection Time: 02/10/19 10:46 PM   Specimen: BLOOD  Result Value Ref Range Status   Specimen Description   Final    BLOOD LEFT ANTECUBITAL Performed at [facility], [address] [place], [place] [address]    Special Requests   Final    BOTTLES DRAWN AEROBIC AND ANAEROBIC Blood Culture adequate volume Performed at [facility], [address] [place], [place] [address]    Culture   Final    NO GROWTH 2 DAYS Performed at [facility], [address], [place], [place] [address]    Report Status PENDING  Incomplete  Blood culture (routine x 2)     Status: None (Preliminary result)   Collection Time: 02/10/19 10:52 PM   Specimen: BLOOD RIGHT HAND  Result Value Ref Range Status   Specimen Description   Final    BLOOD RIGHT HAND Performed at [facility], [address]  [place], [place] [address]    Special Requests   Final    BOTTLES DRAWN AEROBIC ONLY Blood Culture adequate volume Performed at [facility], [address] [place], [place] [address]    Culture   Final    NO GROWTH 2 DAYS Performed at [facility], [address] [place], [place] [address]    Report Status PENDING  Incomplete    Please note: You were cared for by a hospitalist during your hospital stay. Once you are discharged, your primary care physician will handle any further medical issues. Please note that NO REFILLS for any discharge medications will be authorized once you are discharged, as it is imperative that you return to your primary care physician (or establish a relationship with a primary care physician if you do not have one) for your post hospital discharge needs so that they can reassess your need for medications and monitor your lab values.  Signed: Lorin Glass  [facility] 02/13/2019, 11:06 AM

## 2019-02-15 DIAGNOSIS — L03116 Cellulitis of left lower limb: Secondary | ICD-10-CM | POA: Diagnosis not present

## 2019-02-15 DIAGNOSIS — B9689 Other specified bacterial agents as the cause of diseases classified elsewhere: Secondary | ICD-10-CM | POA: Diagnosis not present

## 2019-02-15 DIAGNOSIS — B351 Tinea unguium: Secondary | ICD-10-CM | POA: Diagnosis not present

## 2019-02-15 DIAGNOSIS — L03032 Cellulitis of left toe: Secondary | ICD-10-CM | POA: Diagnosis not present

## 2019-02-15 DIAGNOSIS — Z1629 Resistance to other single specified antibiotic: Secondary | ICD-10-CM | POA: Diagnosis not present

## 2019-02-15 DIAGNOSIS — B951 Streptococcus, group B, as the cause of diseases classified elsewhere: Secondary | ICD-10-CM | POA: Diagnosis not present

## 2019-02-15 DIAGNOSIS — L608 Other nail disorders: Secondary | ICD-10-CM | POA: Diagnosis not present

## 2019-02-15 DIAGNOSIS — L02612 Cutaneous abscess of left foot: Secondary | ICD-10-CM | POA: Diagnosis not present

## 2019-02-15 DIAGNOSIS — E0840 Diabetes mellitus due to underlying condition with diabetic neuropathy, unspecified: Secondary | ICD-10-CM | POA: Diagnosis not present

## 2019-02-16 DIAGNOSIS — E871 Hypo-osmolality and hyponatremia: Secondary | ICD-10-CM | POA: Diagnosis not present

## 2019-02-16 DIAGNOSIS — L03116 Cellulitis of left lower limb: Secondary | ICD-10-CM | POA: Diagnosis not present

## 2019-02-16 LAB — CULTURE, BLOOD (ROUTINE X 2)
Culture: NO GROWTH
Culture: NO GROWTH
Special Requests: ADEQUATE
Special Requests: ADEQUATE

## 2019-02-21 DIAGNOSIS — E871 Hypo-osmolality and hyponatremia: Secondary | ICD-10-CM | POA: Diagnosis not present

## 2019-02-24 DIAGNOSIS — M722 Plantar fascial fibromatosis: Secondary | ICD-10-CM | POA: Diagnosis not present

## 2019-02-24 DIAGNOSIS — L03116 Cellulitis of left lower limb: Secondary | ICD-10-CM | POA: Diagnosis not present

## 2019-02-24 DIAGNOSIS — E0840 Diabetes mellitus due to underlying condition with diabetic neuropathy, unspecified: Secondary | ICD-10-CM | POA: Diagnosis not present

## 2019-02-24 DIAGNOSIS — L03032 Cellulitis of left toe: Secondary | ICD-10-CM | POA: Diagnosis not present

## 2019-03-31 DIAGNOSIS — R067 Sneezing: Secondary | ICD-10-CM | POA: Diagnosis not present

## 2019-03-31 DIAGNOSIS — R21 Rash and other nonspecific skin eruption: Secondary | ICD-10-CM | POA: Diagnosis not present

## 2019-03-31 DIAGNOSIS — R410 Disorientation, unspecified: Secondary | ICD-10-CM | POA: Diagnosis not present

## 2019-03-31 DIAGNOSIS — U071 COVID-19: Secondary | ICD-10-CM | POA: Diagnosis not present

## 2019-03-31 DIAGNOSIS — M2550 Pain in unspecified joint: Secondary | ICD-10-CM | POA: Diagnosis not present

## 2019-03-31 DIAGNOSIS — R439 Unspecified disturbances of smell and taste: Secondary | ICD-10-CM | POA: Diagnosis not present

## 2019-03-31 DIAGNOSIS — H5710 Ocular pain, unspecified eye: Secondary | ICD-10-CM | POA: Diagnosis not present

## 2019-03-31 DIAGNOSIS — R05 Cough: Secondary | ICD-10-CM | POA: Diagnosis not present

## 2019-03-31 DIAGNOSIS — R519 Headache, unspecified: Secondary | ICD-10-CM | POA: Diagnosis not present

## 2019-03-31 DIAGNOSIS — R6889 Other general symptoms and signs: Secondary | ICD-10-CM | POA: Diagnosis not present

## 2019-03-31 DIAGNOSIS — R110 Nausea: Secondary | ICD-10-CM | POA: Diagnosis not present

## 2019-03-31 DIAGNOSIS — R197 Diarrhea, unspecified: Secondary | ICD-10-CM | POA: Diagnosis not present

## 2019-04-19 DIAGNOSIS — M25511 Pain in right shoulder: Secondary | ICD-10-CM | POA: Diagnosis not present

## 2019-04-19 DIAGNOSIS — M25611 Stiffness of right shoulder, not elsewhere classified: Secondary | ICD-10-CM | POA: Diagnosis not present

## 2019-04-19 DIAGNOSIS — G8929 Other chronic pain: Secondary | ICD-10-CM | POA: Diagnosis not present

## 2019-04-19 DIAGNOSIS — M6281 Muscle weakness (generalized): Secondary | ICD-10-CM | POA: Diagnosis not present

## 2019-04-21 DIAGNOSIS — M6281 Muscle weakness (generalized): Secondary | ICD-10-CM | POA: Diagnosis not present

## 2019-04-21 DIAGNOSIS — M25511 Pain in right shoulder: Secondary | ICD-10-CM | POA: Diagnosis not present

## 2019-04-21 DIAGNOSIS — G8929 Other chronic pain: Secondary | ICD-10-CM | POA: Diagnosis not present

## 2019-04-21 DIAGNOSIS — M25611 Stiffness of right shoulder, not elsewhere classified: Secondary | ICD-10-CM | POA: Diagnosis not present

## 2019-04-26 DIAGNOSIS — M25511 Pain in right shoulder: Secondary | ICD-10-CM | POA: Diagnosis not present

## 2019-04-26 DIAGNOSIS — G8929 Other chronic pain: Secondary | ICD-10-CM | POA: Diagnosis not present

## 2019-04-26 DIAGNOSIS — M25611 Stiffness of right shoulder, not elsewhere classified: Secondary | ICD-10-CM | POA: Diagnosis not present

## 2019-04-26 DIAGNOSIS — M6281 Muscle weakness (generalized): Secondary | ICD-10-CM | POA: Diagnosis not present

## 2019-05-02 DIAGNOSIS — Z4789 Encounter for other orthopedic aftercare: Secondary | ICD-10-CM | POA: Diagnosis not present

## 2019-05-05 DIAGNOSIS — Z4789 Encounter for other orthopedic aftercare: Secondary | ICD-10-CM | POA: Diagnosis not present

## 2019-05-06 DIAGNOSIS — M75101 Unspecified rotator cuff tear or rupture of right shoulder, not specified as traumatic: Secondary | ICD-10-CM | POA: Diagnosis not present

## 2019-05-10 DIAGNOSIS — M25511 Pain in right shoulder: Secondary | ICD-10-CM | POA: Diagnosis not present

## 2019-05-10 DIAGNOSIS — M25611 Stiffness of right shoulder, not elsewhere classified: Secondary | ICD-10-CM | POA: Diagnosis not present

## 2019-05-10 DIAGNOSIS — G8929 Other chronic pain: Secondary | ICD-10-CM | POA: Diagnosis not present

## 2019-05-10 DIAGNOSIS — M6281 Muscle weakness (generalized): Secondary | ICD-10-CM | POA: Diagnosis not present

## 2019-05-11 DIAGNOSIS — E119 Type 2 diabetes mellitus without complications: Secondary | ICD-10-CM | POA: Diagnosis not present

## 2019-05-11 DIAGNOSIS — E785 Hyperlipidemia, unspecified: Secondary | ICD-10-CM | POA: Diagnosis not present

## 2019-05-11 DIAGNOSIS — Z125 Encounter for screening for malignant neoplasm of prostate: Secondary | ICD-10-CM | POA: Diagnosis not present

## 2019-05-11 DIAGNOSIS — E039 Hypothyroidism, unspecified: Secondary | ICD-10-CM | POA: Diagnosis not present

## 2019-05-11 DIAGNOSIS — I10 Essential (primary) hypertension: Secondary | ICD-10-CM | POA: Diagnosis not present

## 2019-05-16 DIAGNOSIS — E871 Hypo-osmolality and hyponatremia: Secondary | ICD-10-CM | POA: Diagnosis not present

## 2019-08-05 DIAGNOSIS — S46011D Strain of muscle(s) and tendon(s) of the rotator cuff of right shoulder, subsequent encounter: Secondary | ICD-10-CM | POA: Diagnosis not present

## 2019-11-15 DIAGNOSIS — Z79899 Other long term (current) drug therapy: Secondary | ICD-10-CM | POA: Diagnosis not present

## 2019-11-15 DIAGNOSIS — E119 Type 2 diabetes mellitus without complications: Secondary | ICD-10-CM | POA: Diagnosis not present

## 2019-11-15 DIAGNOSIS — E785 Hyperlipidemia, unspecified: Secondary | ICD-10-CM | POA: Diagnosis not present

## 2019-11-15 DIAGNOSIS — I10 Essential (primary) hypertension: Secondary | ICD-10-CM | POA: Diagnosis not present

## 2019-11-15 DIAGNOSIS — E039 Hypothyroidism, unspecified: Secondary | ICD-10-CM | POA: Diagnosis not present

## 2019-12-01 DIAGNOSIS — Z20822 Contact with and (suspected) exposure to covid-19: Secondary | ICD-10-CM | POA: Diagnosis not present

## 2019-12-01 DIAGNOSIS — H6001 Abscess of right external ear: Secondary | ICD-10-CM | POA: Diagnosis not present

## 2019-12-01 DIAGNOSIS — G4733 Obstructive sleep apnea (adult) (pediatric): Secondary | ICD-10-CM | POA: Diagnosis not present

## 2019-12-01 DIAGNOSIS — Z9989 Dependence on other enabling machines and devices: Secondary | ICD-10-CM | POA: Diagnosis not present

## 2020-02-08 ENCOUNTER — Other Ambulatory Visit: Payer: Self-pay | Admitting: Internal Medicine

## 2020-02-08 DIAGNOSIS — E871 Hypo-osmolality and hyponatremia: Secondary | ICD-10-CM

## 2020-02-16 DIAGNOSIS — Z20822 Contact with and (suspected) exposure to covid-19: Secondary | ICD-10-CM | POA: Diagnosis not present

## 2020-02-16 DIAGNOSIS — R059 Cough, unspecified: Secondary | ICD-10-CM | POA: Diagnosis not present

## 2020-02-16 DIAGNOSIS — R062 Wheezing: Secondary | ICD-10-CM | POA: Diagnosis not present

## 2020-03-13 DIAGNOSIS — H2513 Age-related nuclear cataract, bilateral: Secondary | ICD-10-CM | POA: Diagnosis not present

## 2020-03-13 DIAGNOSIS — Z7984 Long term (current) use of oral hypoglycemic drugs: Secondary | ICD-10-CM | POA: Diagnosis not present

## 2020-03-13 DIAGNOSIS — E119 Type 2 diabetes mellitus without complications: Secondary | ICD-10-CM | POA: Diagnosis not present

## 2020-05-25 DIAGNOSIS — G4733 Obstructive sleep apnea (adult) (pediatric): Secondary | ICD-10-CM | POA: Diagnosis not present

## 2020-05-30 DIAGNOSIS — Z0000 Encounter for general adult medical examination without abnormal findings: Secondary | ICD-10-CM | POA: Diagnosis not present

## 2020-05-30 DIAGNOSIS — E785 Hyperlipidemia, unspecified: Secondary | ICD-10-CM | POA: Diagnosis not present

## 2020-05-30 DIAGNOSIS — E039 Hypothyroidism, unspecified: Secondary | ICD-10-CM | POA: Diagnosis not present

## 2020-05-30 DIAGNOSIS — I10 Essential (primary) hypertension: Secondary | ICD-10-CM | POA: Diagnosis not present

## 2020-05-30 DIAGNOSIS — E119 Type 2 diabetes mellitus without complications: Secondary | ICD-10-CM | POA: Diagnosis not present

## 2020-05-30 DIAGNOSIS — E1169 Type 2 diabetes mellitus with other specified complication: Secondary | ICD-10-CM | POA: Diagnosis not present

## 2020-06-11 DIAGNOSIS — E785 Hyperlipidemia, unspecified: Secondary | ICD-10-CM | POA: Diagnosis not present

## 2020-06-11 DIAGNOSIS — J302 Other seasonal allergic rhinitis: Secondary | ICD-10-CM | POA: Diagnosis not present

## 2020-06-11 DIAGNOSIS — E1169 Type 2 diabetes mellitus with other specified complication: Secondary | ICD-10-CM | POA: Diagnosis not present

## 2020-06-25 DIAGNOSIS — G4733 Obstructive sleep apnea (adult) (pediatric): Secondary | ICD-10-CM | POA: Diagnosis not present

## 2020-06-28 DIAGNOSIS — F419 Anxiety disorder, unspecified: Secondary | ICD-10-CM | POA: Diagnosis not present

## 2020-06-28 DIAGNOSIS — Z202 Contact with and (suspected) exposure to infections with a predominantly sexual mode of transmission: Secondary | ICD-10-CM | POA: Diagnosis not present

## 2020-07-25 DIAGNOSIS — G4733 Obstructive sleep apnea (adult) (pediatric): Secondary | ICD-10-CM | POA: Diagnosis not present

## 2020-08-16 DIAGNOSIS — I10 Essential (primary) hypertension: Secondary | ICD-10-CM | POA: Diagnosis not present

## 2020-08-16 DIAGNOSIS — E039 Hypothyroidism, unspecified: Secondary | ICD-10-CM | POA: Diagnosis not present

## 2020-08-16 DIAGNOSIS — E1140 Type 2 diabetes mellitus with diabetic neuropathy, unspecified: Secondary | ICD-10-CM | POA: Diagnosis not present

## 2020-08-16 DIAGNOSIS — F064 Anxiety disorder due to known physiological condition: Secondary | ICD-10-CM | POA: Diagnosis not present

## 2020-08-16 DIAGNOSIS — E119 Type 2 diabetes mellitus without complications: Secondary | ICD-10-CM | POA: Diagnosis not present

## 2020-08-16 DIAGNOSIS — Z0000 Encounter for general adult medical examination without abnormal findings: Secondary | ICD-10-CM | POA: Diagnosis not present

## 2020-08-25 DIAGNOSIS — G4733 Obstructive sleep apnea (adult) (pediatric): Secondary | ICD-10-CM | POA: Diagnosis not present

## 2020-09-06 DIAGNOSIS — L03032 Cellulitis of left toe: Secondary | ICD-10-CM | POA: Diagnosis not present

## 2020-09-06 DIAGNOSIS — L02612 Cutaneous abscess of left foot: Secondary | ICD-10-CM | POA: Diagnosis not present

## 2020-09-24 DIAGNOSIS — G4733 Obstructive sleep apnea (adult) (pediatric): Secondary | ICD-10-CM | POA: Diagnosis not present

## 2020-10-25 DIAGNOSIS — G4733 Obstructive sleep apnea (adult) (pediatric): Secondary | ICD-10-CM | POA: Diagnosis not present

## 2020-10-30 DIAGNOSIS — E119 Type 2 diabetes mellitus without complications: Secondary | ICD-10-CM | POA: Diagnosis not present

## 2020-11-26 ENCOUNTER — Emergency Department (HOSPITAL_BASED_OUTPATIENT_CLINIC_OR_DEPARTMENT_OTHER): Payer: BC Managed Care – PPO

## 2020-11-26 ENCOUNTER — Other Ambulatory Visit: Payer: Self-pay

## 2020-11-26 ENCOUNTER — Emergency Department (HOSPITAL_BASED_OUTPATIENT_CLINIC_OR_DEPARTMENT_OTHER)
Admission: EM | Admit: 2020-11-26 | Discharge: 2020-11-27 | Disposition: A | Discharge status: Home / Self Care | Payer: BC Managed Care – PPO | Attending: Emergency Medicine | Admitting: Emergency Medicine

## 2020-11-26 ENCOUNTER — Encounter (HOSPITAL_BASED_OUTPATIENT_CLINIC_OR_DEPARTMENT_OTHER): Payer: Self-pay | Admitting: Emergency Medicine

## 2020-11-26 DIAGNOSIS — I10 Essential (primary) hypertension: Secondary | ICD-10-CM | POA: Diagnosis not present

## 2020-11-26 DIAGNOSIS — R1032 Left lower quadrant pain: Secondary | ICD-10-CM | POA: Insufficient documentation

## 2020-11-26 DIAGNOSIS — R1110 Vomiting, unspecified: Secondary | ICD-10-CM | POA: Diagnosis not present

## 2020-11-26 DIAGNOSIS — R197 Diarrhea, unspecified: Secondary | ICD-10-CM | POA: Insufficient documentation

## 2020-11-26 DIAGNOSIS — R112 Nausea with vomiting, unspecified: Secondary | ICD-10-CM

## 2020-11-26 DIAGNOSIS — Z79899 Other long term (current) drug therapy: Secondary | ICD-10-CM | POA: Diagnosis not present

## 2020-11-26 DIAGNOSIS — E039 Hypothyroidism, unspecified: Secondary | ICD-10-CM | POA: Insufficient documentation

## 2020-11-26 DIAGNOSIS — K760 Fatty (change of) liver, not elsewhere classified: Secondary | ICD-10-CM | POA: Diagnosis not present

## 2020-11-26 DIAGNOSIS — R109 Unspecified abdominal pain: Secondary | ICD-10-CM | POA: Diagnosis not present

## 2020-11-26 LAB — URINALYSIS, ROUTINE W REFLEX MICROSCOPIC
Bilirubin Urine: NEGATIVE
Glucose, UA: NEGATIVE mg/dL
Hgb urine dipstick: NEGATIVE
Ketones, ur: 15 mg/dL — AB
Leukocytes,Ua: NEGATIVE
Nitrite: NEGATIVE
Protein, ur: NEGATIVE mg/dL
Specific Gravity, Urine: 1.015 (ref 1.005–1.030)
pH: 6 (ref 5.0–8.0)

## 2020-11-26 LAB — COMPREHENSIVE METABOLIC PANEL
ALT: 32 U/L (ref 0–44)
AST: 28 U/L (ref 15–41)
Albumin: 4.7 g/dL (ref 3.5–5.0)
Alkaline Phosphatase: 61 U/L (ref 38–126)
Anion gap: 12 (ref 5–15)
BUN: 22 mg/dL — ABNORMAL HIGH (ref 6–20)
CO2: 23 mmol/L (ref 22–32)
Calcium: 9.3 mg/dL (ref 8.9–10.3)
Chloride: 100 mmol/L (ref 98–111)
Creatinine, Ser: 0.94 mg/dL (ref 0.61–1.24)
GFR, Estimated: >60 mL/min (ref >60)
Glucose, Bld: 143 mg/dL — ABNORMAL HIGH (ref 70–99)
Potassium: 4.4 mmol/L (ref 3.5–5.1)
Sodium: 135 mmol/L (ref 135–145)
Total Bilirubin: 0.6 mg/dL (ref 0.3–1.2)
Total Protein: 8.3 g/dL — ABNORMAL HIGH (ref 6.5–8.1)

## 2020-11-26 LAB — LACTIC ACID, PLASMA: Lactic Acid, Venous: 2.1 mmol/L (ref 0.5–1.9)

## 2020-11-26 LAB — CBC
HCT: 44.2 % (ref 39.0–52.0)
Hemoglobin: 15.3 g/dL (ref 13.0–17.0)
MCH: 30.7 pg (ref 26.0–34.0)
MCHC: 34.6 g/dL (ref 30.0–36.0)
MCV: 88.8 fL (ref 80.0–100.0)
Platelets: 295 10*3/uL (ref 150–400)
RBC: 4.98 MIL/uL (ref 4.22–5.81)
RDW: 12.9 % (ref 11.5–15.5)
WBC: 16.9 10*3/uL — ABNORMAL HIGH (ref 4.0–10.5)
nRBC: 0 % (ref 0.0–0.2)

## 2020-11-26 LAB — LIPASE, BLOOD: Lipase: 26 U/L (ref 11–51)

## 2020-11-26 MED ORDER — ONDANSETRON HCL 4 MG/2ML IJ SOLN
4.0000 mg | Freq: Once | INTRAMUSCULAR | Status: AC | PRN
  Administered 2020-11-26: 4 mg via INTRAVENOUS
  Filled 2020-11-26: qty 2

## 2020-11-26 MED ORDER — SODIUM CHLORIDE 0.9 % IV BOLUS
500.0000 mL | Freq: Once | INTRAVENOUS | Status: AC
  Administered 2020-11-26: 500 mL via INTRAVENOUS

## 2020-11-26 MED ORDER — SODIUM CHLORIDE 0.9 % IV BOLUS
1000.0000 mL | Freq: Once | INTRAVENOUS | Status: AC
  Administered 2020-11-26: 1000 mL via INTRAVENOUS

## 2020-11-26 MED ORDER — MORPHINE SULFATE (PF) 4 MG/ML IV SOLN
4.0000 mg | Freq: Once | INTRAVENOUS | Status: AC
  Administered 2020-11-26: 4 mg via INTRAVENOUS
  Filled 2020-11-26: qty 1

## 2020-11-26 MED ORDER — IOHEXOL 350 MG/ML SOLN
100.0000 mL | Freq: Once | INTRAVENOUS | Status: AC | PRN
  Administered 2020-11-26: 100 mL via INTRAVENOUS

## 2020-11-26 NOTE — ED Provider Notes (Signed)
[facility] EMERGENCY DEPARTMENT Provider Note   CSN: [id number] Arrival date & time: 11/26/20  1926     History Chief Complaint  Patient presents with   Emesis    Mark Good is a 55 y.o. male.  He is complaining of left lateral abdominal pain left flank pain that started yesterday.  Today he began experiencing nausea and had 5 episodes of diarrhea.  Nonbloody.  He also vomited once.  Feels hot and cold.  No chest pain or shortness of breath.  No urinary symptoms.  No trauma.  No sick contacts or recent travel.  The history is provided by the patient.  Diarrhea Quality:  Watery Severity:  Moderate Onset quality:  Sudden Number of episodes:  5 Duration:  9 hours Timing:  Intermittent Progression:  Unchanged Relieved by:  None tried Worsened by:  Nothing Ineffective treatments:  None tried Associated symptoms: abdominal pain, diaphoresis and vomiting   Associated symptoms: no fever and no headaches   Abdominal pain:    Location:  L flank and LLQ   Quality: aching     Severity:  Moderate   Onset quality:  Gradual   Duration:  2 days   Timing:  Intermittent   Progression:  Unchanged   Chronicity:  New Risk factors: no recent antibiotic use, no sick contacts, no suspicious food intake and no travel to endemic areas       Past Medical History:  Diagnosis Date   Anxiety    Depression    GERD (gastroesophageal reflux disease)    Headache(784.0)    Hypertension    Hypothyroidism    Sleep apnea    uses cpap    Patient Active Problem List   Diagnosis Date Noted   Cellulitis of left leg 02/11/2019   Hyponatremia 02/10/2019   Acid reflux 05/10/2015   Adult hypothyroidism 05/10/2015   Essential (primary) hypertension 02/19/2015   Neuropathy 01/10/2015   Obstructive apnea 01/10/2015   Hereditary and idiopathic peripheral neuropathy 12/25/2013   Dysphagia 12/25/2013   Abnormality of gait 12/25/2013    Past Surgical History:  Procedure Laterality  Date   CARDIAC CATHETERIZATION     Dr Tresa Endo   KNEE ARTHROSCOPY Right    left leg repair of laceration     LUMBAR FUSION  09/23/2012   Dr Shon Baton   NASAL SEPTOPLASTY W/ TURBINOPLASTY         Family History  Problem Relation Age of Onset   Fibromyalgia Mother    Other Mother        back and knee issues   Fibromyalgia Father    Other Brother    Other Brother    Other Brother     Social History   Tobacco Use   Smoking status: Never   Smokeless tobacco: Never  Vaping Use   Vaping Use: Never used  Substance Use Topics   Alcohol use: Yes    Comment: drink once a month   Drug use: No    Home Medications Prior to Admission medications   Medication Sig Start Date End Date Taking? Authorizing Provider  Aspirin-Acetaminophen-Caffeine (GOODYS EXTRA STRENGTH PO) Take 1 packet by mouth daily as needed (pain).     [provider]  atenolol (TENORMIN) 50 MG tablet Take 1 tablet (50 mg total) by mouth daily. 02/14/19 03/16/19  Lorin Glass, MD  Cholecalciferol (VITAMIN D3) 2000 UNITS TABS Take 1 tablet by mouth daily.    [provider]  DULoxetine (CYMBALTA) 30 MG capsule Take  3 times daily. Please discontinue all previous prescriptions. 04/07/17   Thresa Ross, MD  fluticasone (FLONASE) 50 MCG/ACT nasal spray Place 1 spray into both nostrils daily as needed for allergies or rhinitis.  10/27/14   [provider]  gabapentin (NEURONTIN) 600 MG tablet Take 600 mg by mouth 2 (two) times daily. Reported on 05/10/2015    [provider]  levothyroxine (SYNTHROID) 137 MCG tablet Take 137 mcg by mouth daily before breakfast.    [provider]  metFORMIN (GLUCOPHAGE) 500 MG tablet Take 500 mg by mouth 2 (two) times daily with a meal.     [provider]  pantoprazole (PROTONIX) 40 MG tablet Take 40 mg by mouth daily.    [provider]  vitamin B-12 (CYANOCOBALAMIN) 1000 MCG tablet Take by mouth.    [provider]   lamoTRIgine (LAMICTAL) 100 MG tablet Take 1 tablet (100 mg total) by mouth daily. Patient not taking: Reported on 04/07/2017 12/09/16 04/07/17  Thresa Ross, MD    Allergies    Bupropion and Codeine  Review of Systems   Review of Systems  Constitutional:  Positive for diaphoresis. Negative for fever.  HENT:  Negative for sore throat.   Eyes:  Negative for visual disturbance.  Respiratory:  Negative for shortness of breath.   Cardiovascular:  Negative for chest pain.  Gastrointestinal:  Positive for abdominal pain, diarrhea and vomiting.  Genitourinary:  Negative for dysuria.  Musculoskeletal:  Negative for neck pain.  Skin:  Negative for rash.  Neurological:  Negative for headaches.   Physical Exam Updated Vital Signs BP 127/87   Pulse 85   Temp 98.4 F (36.9 C) (Tympanic)   Resp 20   Ht 6' (1.829 m)   Wt (!) 145.2 kg   SpO2 98%   BMI 43.40 kg/m   Physical Exam Vitals and nursing note reviewed.  Constitutional:      Appearance: Normal appearance. He is well-developed.  HENT:     Head: Normocephalic and atraumatic.  Eyes:     Conjunctiva/sclera: Conjunctivae normal.  Cardiovascular:     Rate and Rhythm: Normal rate and regular rhythm.     Heart sounds: No murmur heard. Pulmonary:     Effort: Pulmonary effort is normal. No respiratory distress.     Breath sounds: Normal breath sounds.  Abdominal:     Palpations: Abdomen is soft.     Tenderness: There is abdominal tenderness (Left lower quadrant into left flank). There is no guarding or rebound.  Musculoskeletal:        General: No deformity or signs of injury. Normal range of motion.     Cervical back: Neck supple.  Skin:    General: Skin is warm and dry.  Neurological:     General: No focal deficit present.     Mental Status: He is alert.    ED Results / Procedures / Treatments   Labs (all labs ordered are listed, but only abnormal results are displayed) Labs Reviewed  COMPREHENSIVE METABOLIC PANEL -  Abnormal; Notable for the following components:      Result Value   Glucose, Bld 143 (*)    BUN 22 (*)    Total Protein 8.3 (*)    All other components within normal limits  CBC - Abnormal; Notable for the following components:   WBC 16.9 (*)    All other components within normal limits  URINALYSIS, ROUTINE W REFLEX MICROSCOPIC - Abnormal; Notable for the following components:   Ketones, ur  15 (*)    All other components within normal limits  LACTIC ACID, PLASMA - Abnormal; Notable for the following components:   Lactic Acid, Venous 2.1 (*)    All other components within normal limits  LACTIC ACID, PLASMA - Abnormal; Notable for the following components:   Lactic Acid, Venous 2.1 (*)    All other components within normal limits  CULTURE, BLOOD (ROUTINE X 2)  CULTURE, BLOOD (ROUTINE X 2)  GASTROINTESTINAL PANEL BY PCR, STOOL (REPLACES STOOL CULTURE)  LIPASE, BLOOD    EKG EKG Interpretation  Date/Time:  Monday November 26 2020 19:54:38 EDT Ventricular Rate:  74 PR Interval:  153 QRS Duration: 98 QT Interval:  386 QTC Calculation: 429 R Axis:   41 Text Interpretation: Sinus rhythm Borderline repolarization abnormality No significant change since prior 8/13 Confirmed by Meridee Score [phone]) on 11/26/2020 7:59:02 PM  Radiology CT Abdomen Pelvis W Contrast  Result Date: 11/26/2020 CLINICAL DATA:  Abdominal pain with nausea, vomiting and diarrhea. EXAM: CT ABDOMEN AND PELVIS WITH CONTRAST TECHNIQUE: Multidetector CT imaging of the abdomen and pelvis was performed using the standard protocol following bolus administration of intravenous contrast. CONTRAST:  OMNIPAQUE IOHEXOL 350 MG/ML SOLN COMPARISON:  None. FINDINGS: Lower chest: No acute abnormality. Hepatobiliary: There is diffuse fatty infiltration of the liver parenchyma. No focal liver abnormality is seen. No gallstones, gallbladder wall thickening, or biliary dilatation. Pancreas: Unremarkable. No pancreatic ductal  dilatation or surrounding inflammatory changes. Spleen: Normal in size without focal abnormality. Adrenals/Urinary Tract: Adrenal glands are unremarkable. Kidneys are normal, without obstructing renal calculi, focal lesion, or hydronephrosis. A 3 mm nonobstructing renal stone is seen within the mid right kidney. Bladder is unremarkable. Stomach/Bowel: Stomach is within normal limits. Appendix appears normal. No evidence of bowel wall thickening, distention, or inflammatory changes. Vascular/Lymphatic: No significant vascular findings are present. No enlarged abdominal or pelvic lymph nodes. Reproductive: Prostate is unremarkable. Other: No abdominal wall hernia or abnormality. No abdominopelvic ascites. Musculoskeletal: Bilateral metallic density pedicle screws are seen at the levels of L5 and S1. IMPRESSION: 1. Hepatic steatosis. 2. 3 mm nonobstructing renal stone within the right kidney. 3. Postoperative changes within the lower lumbar spine. Electronically Signed   By: Aram Candela M.D.   On: 11/26/2020 20:54    Procedures Procedures   Medications Ordered in ED Medications  ondansetron (ZOFRAN) injection 4 mg (4 mg Intravenous Given 11/26/20 2008)  sodium chloride 0.9 % bolus 1,000 mL ( Intravenous Stopped 11/26/20 2039)  morphine 4 MG/ML injection 4 mg (4 mg Intravenous Given 11/26/20 2009)  iohexol (OMNIPAQUE) 350 MG/ML injection 100 mL (100 mLs Intravenous Contrast Given 11/26/20 2036)  sodium chloride 0.9 % bolus 500 mL ( Intravenous Stopped 11/27/20 0026)  sodium chloride 0.9 % bolus 500 mL ( Intravenous Stopped 11/27/20 0251)    ED Course  I have reviewed the triage vital signs and the nursing notes.  Pertinent labs & imaging results that were available during my care of the patient were reviewed by me and considered in my medical decision making (see chart for details).  Clinical Course as of 11/27/20 Renee Rival Nov 26, 2020  2257 Lactate is mildly elevated at 2.1.  He is feeling  better.  Will cover with some more fluids.  Still awaiting for stool sample and urinalysis [MB]    Clinical Course User Index [MB] Terrilee Files, MD   MDM Rules/Calculators/A&P  This patient complains of left lateral abdominal pain, nausea vomiting diarrhea; this involves an extensive number of treatment Options and is a complaint that carries with it a high risk of complications and Morbidity. The differential includes diverticulitis, colitis, AGE, UTI, sepsis  I ordered, reviewed and interpreted labs, which included CBC with elevated white count normal hemoglobin, chemistries fairly unremarkable, LFTs normal, urinalysis without signs of infection, lactate elevated I ordered medication IV fluids nausea and pain medication with improvement in his symptoms I ordered imaging studies which included CT abdomen and pelvis and I independently    visualized and interpreted imaging which showed no acute findings Additional history obtained from patient's wife Previous records obtained and reviewed in epic no recent admissions  After the interventions stated above, I reevaluated the patient and found patient to be symptomatically improved.  He has had no further episodes of vomiting or diarrhea here.  Care signed out to oncoming provider Dr. Daun Peacock to follow-up on repeat lactate.  Likely can be discharged if not significantly worsening.  Patient updated on plan and is comfortable with this.   Final Clinical Impression(s) / ED Diagnoses Final diagnoses:  Nausea vomiting and diarrhea    Rx / DC Orders ED Discharge Orders     None        Terrilee Files, MD 11/27/20 [phone]

## 2020-11-26 NOTE — ED Triage Notes (Signed)
Pt with n/v/d and abd pain starting today.

## 2020-11-26 NOTE — Discharge Instructions (Addendum)
You are seen in the emergency department for nausea vomiting and diarrhea.  You had lab work that showed your infection count to be elevated.  We ordered stool studies for you were unable to provide a sample.  You had a CAT scan that did not show any obvious acute findings.  Please rest and drink plenty of fluids.  Follow-up with your primary care doctor.  Return to the emergency department if any worsening or concerning symptoms

## 2020-11-27 LAB — LACTIC ACID, PLASMA: Lactic Acid, Venous: 2.1 mmol/L (ref 0.5–1.9)

## 2020-11-27 MED ORDER — SODIUM CHLORIDE 0.9 % IV BOLUS
500.0000 mL | Freq: Once | INTRAVENOUS | Status: AC
  Administered 2020-11-27: 500 mL via INTRAVENOUS

## 2020-12-01 LAB — CULTURE, BLOOD (ROUTINE X 2)
Culture: NO GROWTH
Special Requests: ADEQUATE

## 2020-12-02 LAB — CULTURE, BLOOD (ROUTINE X 2)
Culture: NO GROWTH
Special Requests: ADEQUATE

## 2020-12-07 DIAGNOSIS — Z1159 Encounter for screening for other viral diseases: Secondary | ICD-10-CM | POA: Diagnosis not present

## 2020-12-07 DIAGNOSIS — E039 Hypothyroidism, unspecified: Secondary | ICD-10-CM | POA: Diagnosis not present

## 2020-12-07 DIAGNOSIS — Z114 Encounter for screening for human immunodeficiency virus [HIV]: Secondary | ICD-10-CM | POA: Diagnosis not present

## 2020-12-07 DIAGNOSIS — E1140 Type 2 diabetes mellitus with diabetic neuropathy, unspecified: Secondary | ICD-10-CM | POA: Diagnosis not present

## 2021-01-17 DIAGNOSIS — M109 Gout, unspecified: Secondary | ICD-10-CM | POA: Diagnosis not present

## 2021-01-17 DIAGNOSIS — M25572 Pain in left ankle and joints of left foot: Secondary | ICD-10-CM | POA: Diagnosis not present

## 2021-01-25 DIAGNOSIS — J111 Influenza due to unidentified influenza virus with other respiratory manifestations: Secondary | ICD-10-CM | POA: Diagnosis not present

## 2021-02-14 DIAGNOSIS — E119 Type 2 diabetes mellitus without complications: Secondary | ICD-10-CM | POA: Diagnosis not present

## 2021-04-19 DIAGNOSIS — E119 Type 2 diabetes mellitus without complications: Secondary | ICD-10-CM | POA: Diagnosis not present

## 2021-04-19 DIAGNOSIS — Z0000 Encounter for general adult medical examination without abnormal findings: Secondary | ICD-10-CM | POA: Diagnosis not present

## 2021-04-19 DIAGNOSIS — I10 Essential (primary) hypertension: Secondary | ICD-10-CM | POA: Diagnosis not present

## 2021-04-19 DIAGNOSIS — E1140 Type 2 diabetes mellitus with diabetic neuropathy, unspecified: Secondary | ICD-10-CM | POA: Diagnosis not present

## 2021-05-01 DIAGNOSIS — H2513 Age-related nuclear cataract, bilateral: Secondary | ICD-10-CM | POA: Diagnosis not present

## 2021-05-01 DIAGNOSIS — E119 Type 2 diabetes mellitus without complications: Secondary | ICD-10-CM | POA: Diagnosis not present

## 2021-05-01 DIAGNOSIS — Z7984 Long term (current) use of oral hypoglycemic drugs: Secondary | ICD-10-CM | POA: Diagnosis not present

## 2021-08-20 DIAGNOSIS — L03032 Cellulitis of left toe: Secondary | ICD-10-CM | POA: Diagnosis not present

## 2021-08-20 DIAGNOSIS — S9032XA Contusion of left foot, initial encounter: Secondary | ICD-10-CM | POA: Diagnosis not present

## 2021-08-20 DIAGNOSIS — E0840 Diabetes mellitus due to underlying condition with diabetic neuropathy, unspecified: Secondary | ICD-10-CM | POA: Diagnosis not present

## 2021-08-20 DIAGNOSIS — L02612 Cutaneous abscess of left foot: Secondary | ICD-10-CM | POA: Diagnosis not present

## 2021-09-02 DIAGNOSIS — E039 Hypothyroidism, unspecified: Secondary | ICD-10-CM | POA: Diagnosis not present

## 2021-09-02 DIAGNOSIS — R5383 Other fatigue: Secondary | ICD-10-CM | POA: Diagnosis not present

## 2021-09-02 DIAGNOSIS — N529 Male erectile dysfunction, unspecified: Secondary | ICD-10-CM | POA: Diagnosis not present

## 2021-09-02 DIAGNOSIS — E1140 Type 2 diabetes mellitus with diabetic neuropathy, unspecified: Secondary | ICD-10-CM | POA: Diagnosis not present

## 2021-09-02 DIAGNOSIS — E669 Obesity, unspecified: Secondary | ICD-10-CM | POA: Diagnosis not present

## 2021-09-02 DIAGNOSIS — R209 Unspecified disturbances of skin sensation: Secondary | ICD-10-CM | POA: Diagnosis not present

## 2021-09-02 DIAGNOSIS — E785 Hyperlipidemia, unspecified: Secondary | ICD-10-CM | POA: Diagnosis not present

## 2021-09-24 DIAGNOSIS — E291 Testicular hypofunction: Secondary | ICD-10-CM | POA: Diagnosis not present

## 2021-12-10 DIAGNOSIS — M5431 Sciatica, right side: Secondary | ICD-10-CM | POA: Diagnosis not present

## 2021-12-17 ENCOUNTER — Telehealth: Payer: Self-pay

## 2021-12-17 NOTE — Patient Outreach (Signed)
  Care Coordination   Initial Visit Note   12/17/2021 Name: Mark Good MRN: [id number] DOB: [birth year]  Mark Good is a 56 y.o. year old male who sees Mark Lass, MD for primary care. I spoke with  Mark Good by phone today.  What matters to the patients health and wellness today?  Placed call today to reviewed and offer [facility] care coordination program. Patient reports that he no longer goes to [place] assessments and interventions completed:  No     Care Coordination Interventions Activated:  No  Care Coordination Interventions:  No, not indicated   Follow up plan: No further intervention required.   Encounter Outcome:  Pt. Visit Completed   Mark Rand, RN, BSN, CEN [place] Coordinator [phone]

## 2022-01-02 DIAGNOSIS — M25511 Pain in right shoulder: Secondary | ICD-10-CM | POA: Diagnosis not present

## 2022-01-02 DIAGNOSIS — M25551 Pain in right hip: Secondary | ICD-10-CM | POA: Diagnosis not present

## 2022-01-08 ENCOUNTER — Ambulatory Visit: Payer: Self-pay | Admitting: Internal Medicine

## 2022-01-10 ENCOUNTER — Institutional Professional Consult (permissible substitution): Payer: BC Managed Care – PPO | Admitting: Pulmonary Disease

## 2022-01-21 DIAGNOSIS — F331 Major depressive disorder, recurrent, moderate: Secondary | ICD-10-CM | POA: Diagnosis not present

## 2022-01-22 ENCOUNTER — Institutional Professional Consult (permissible substitution): Payer: BC Managed Care – PPO | Admitting: Pulmonary Disease

## 2022-02-07 DIAGNOSIS — M109 Gout, unspecified: Secondary | ICD-10-CM | POA: Diagnosis not present

## 2022-02-25 DIAGNOSIS — F3341 Major depressive disorder, recurrent, in partial remission: Secondary | ICD-10-CM | POA: Diagnosis not present

## 2022-03-14 DIAGNOSIS — M7061 Trochanteric bursitis, right hip: Secondary | ICD-10-CM | POA: Diagnosis not present

## 2022-03-14 DIAGNOSIS — M1611 Unilateral primary osteoarthritis, right hip: Secondary | ICD-10-CM | POA: Diagnosis not present

## 2022-03-14 DIAGNOSIS — S9031XA Contusion of right foot, initial encounter: Secondary | ICD-10-CM | POA: Diagnosis not present

## 2022-03-14 DIAGNOSIS — M79671 Pain in right foot: Secondary | ICD-10-CM | POA: Diagnosis not present

## 2022-03-14 DIAGNOSIS — M25551 Pain in right hip: Secondary | ICD-10-CM | POA: Diagnosis not present

## 2022-04-18 DIAGNOSIS — M2011 Hallux valgus (acquired), right foot: Secondary | ICD-10-CM | POA: Diagnosis not present

## 2022-04-18 DIAGNOSIS — L84 Corns and callosities: Secondary | ICD-10-CM | POA: Diagnosis not present

## 2022-04-18 DIAGNOSIS — L03032 Cellulitis of left toe: Secondary | ICD-10-CM | POA: Diagnosis not present

## 2022-04-18 DIAGNOSIS — E0840 Diabetes mellitus due to underlying condition with diabetic neuropathy, unspecified: Secondary | ICD-10-CM | POA: Diagnosis not present

## 2022-04-18 DIAGNOSIS — L02612 Cutaneous abscess of left foot: Secondary | ICD-10-CM | POA: Diagnosis not present

## 2022-04-29 DIAGNOSIS — E291 Testicular hypofunction: Secondary | ICD-10-CM | POA: Diagnosis not present

## 2022-04-29 DIAGNOSIS — E039 Hypothyroidism, unspecified: Secondary | ICD-10-CM | POA: Diagnosis not present

## 2022-04-29 DIAGNOSIS — I10 Essential (primary) hypertension: Secondary | ICD-10-CM | POA: Diagnosis not present

## 2022-04-29 DIAGNOSIS — E1140 Type 2 diabetes mellitus with diabetic neuropathy, unspecified: Secondary | ICD-10-CM | POA: Diagnosis not present

## 2022-05-02 DIAGNOSIS — L02611 Cutaneous abscess of right foot: Secondary | ICD-10-CM | POA: Diagnosis not present

## 2022-05-02 DIAGNOSIS — E0840 Diabetes mellitus due to underlying condition with diabetic neuropathy, unspecified: Secondary | ICD-10-CM | POA: Diagnosis not present

## 2022-05-02 DIAGNOSIS — L03031 Cellulitis of right toe: Secondary | ICD-10-CM | POA: Diagnosis not present

## 2022-07-08 DIAGNOSIS — E039 Hypothyroidism, unspecified: Secondary | ICD-10-CM | POA: Diagnosis not present

## 2022-07-08 DIAGNOSIS — E291 Testicular hypofunction: Secondary | ICD-10-CM | POA: Diagnosis not present

## 2022-07-08 DIAGNOSIS — E1140 Type 2 diabetes mellitus with diabetic neuropathy, unspecified: Secondary | ICD-10-CM | POA: Diagnosis not present

## 2022-07-08 DIAGNOSIS — Z125 Encounter for screening for malignant neoplasm of prostate: Secondary | ICD-10-CM | POA: Diagnosis not present

## 2022-07-08 DIAGNOSIS — Z13220 Encounter for screening for lipoid disorders: Secondary | ICD-10-CM | POA: Diagnosis not present

## 2022-07-15 DIAGNOSIS — G4730 Sleep apnea, unspecified: Secondary | ICD-10-CM | POA: Diagnosis not present

## 2022-08-01 IMAGING — CT CT ABD-PELV W/ CM
2 of 5 series · 16 of 46 positions shown, 18 images · IV contrast (Omnipaque)
Comparison: None.

CLINICAL DATA: Abdominal pain with nausea, vomiting and diarrhea.

EXAM:
CT ABDOMEN AND PELVIS WITH CONTRAST
TECHNIQUE: Multidetector CT imaging of the abdomen and pelvis was performed
using the standard protocol following bolus administration of
intravenous contrast.
CONTRAST:  100mL OMNIPAQUE IOHEXOL 350 MG/ML SOLN

[Series 2: axial st · axial · 0.98mm/px · z∈[-440,+50]mm · 13 of 112 slices shown, 15 images]
[im 7/112  soft-tissue]
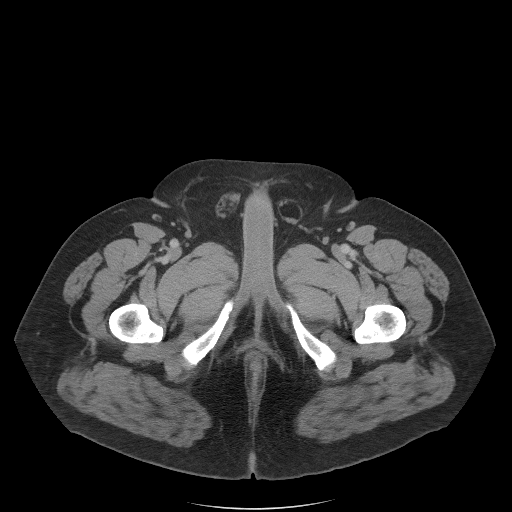
[im 7/112  bone]
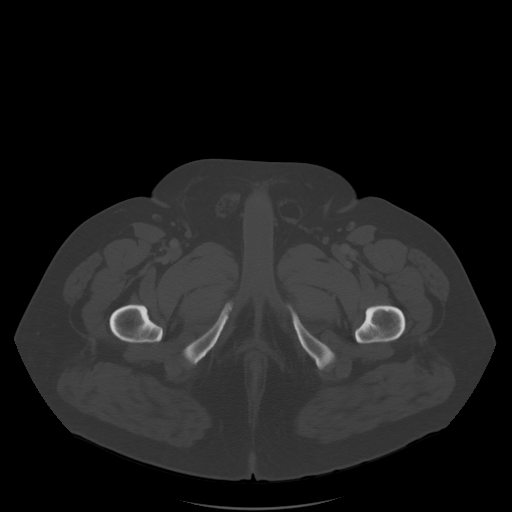
[im 14/112  soft-tissue]
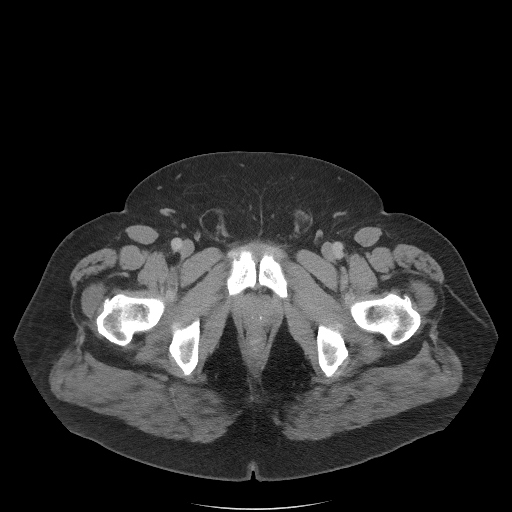
[im 27/112  soft-tissue]
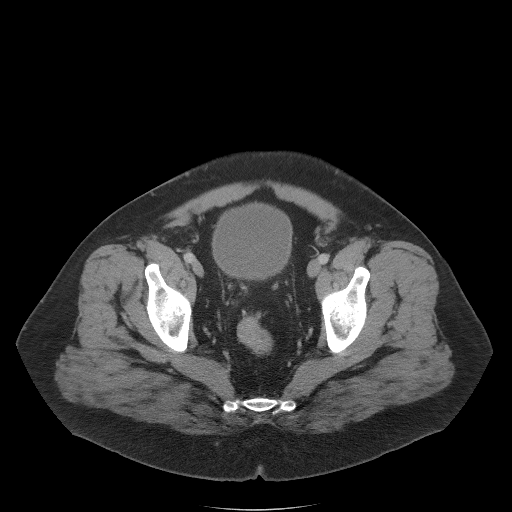
[im 33/112  soft-tissue]
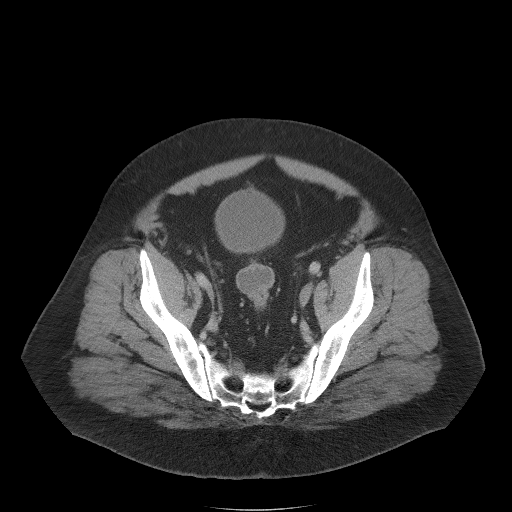
[im 40/112  soft-tissue]
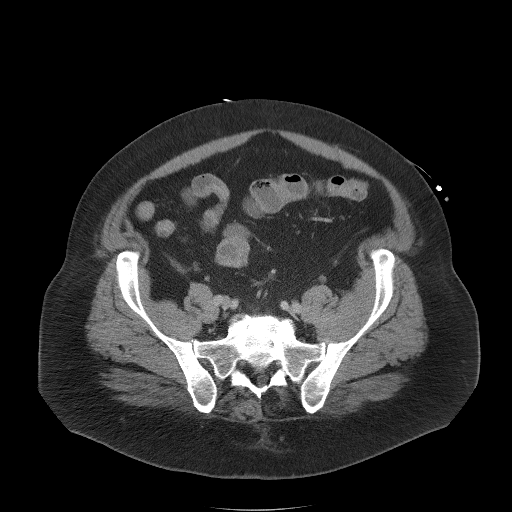
[im 46/112  soft-tissue]
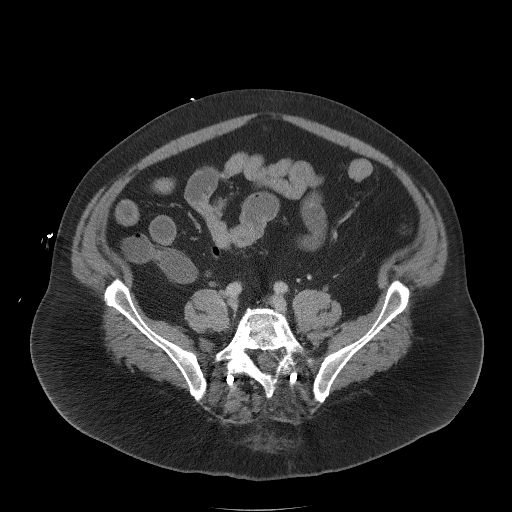
[im 59/112  soft-tissue]
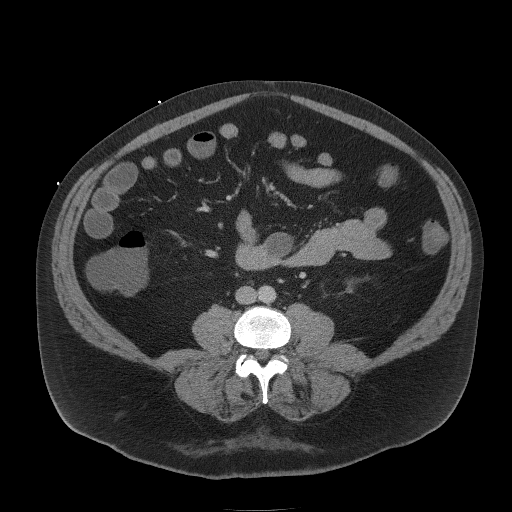
[im 66/112  soft-tissue]
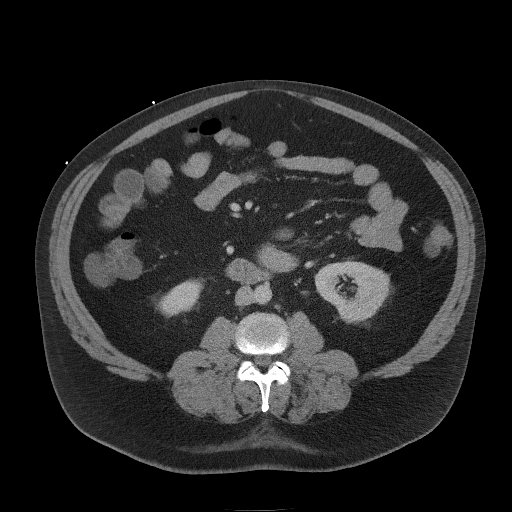
[im 72/112  soft-tissue]
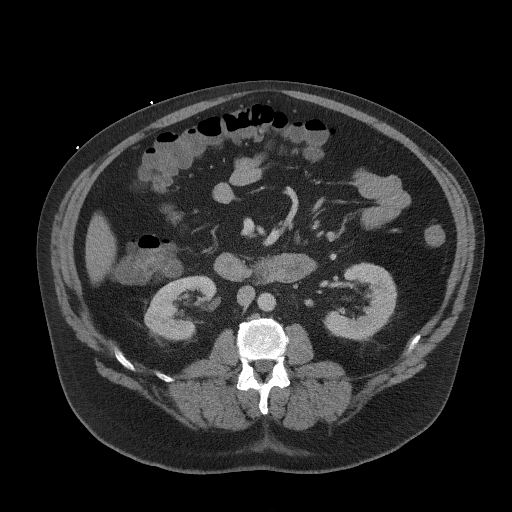
[im 72/112  bone]
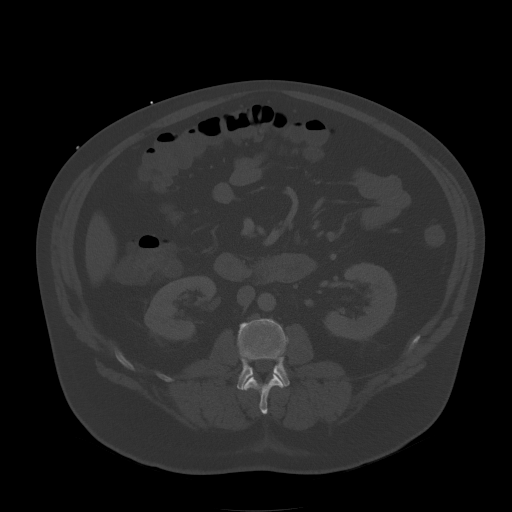
[im 79/112  soft-tissue]
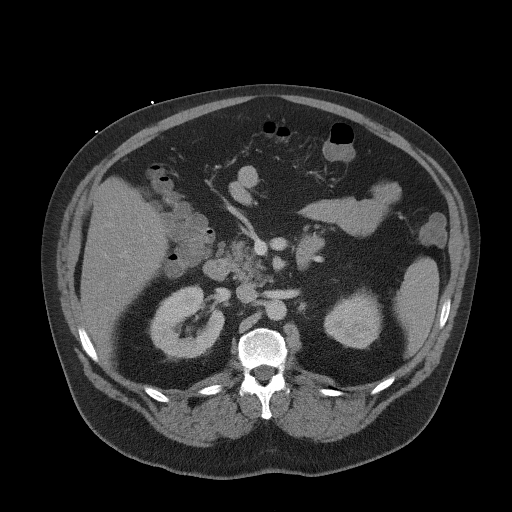
[im 85/112  soft-tissue]
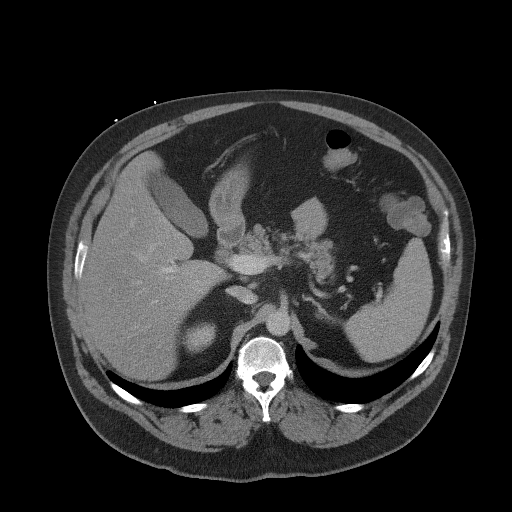
[im 98/112  soft-tissue]
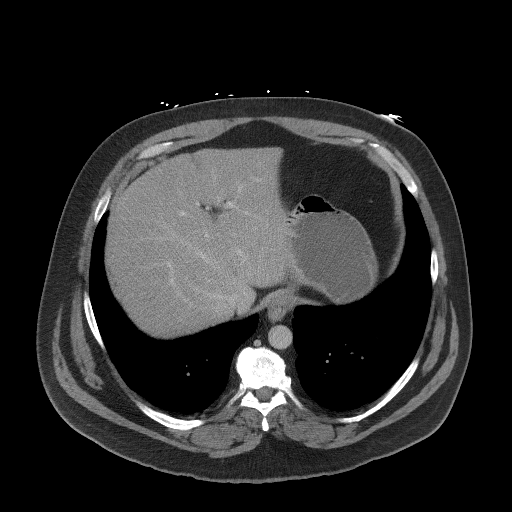
[im 105/112  soft-tissue]
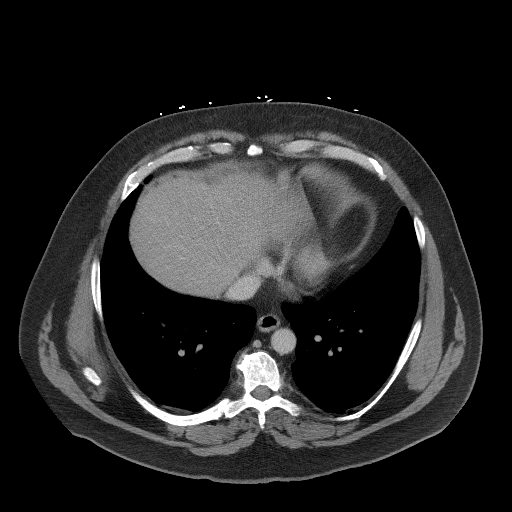

[Series 5: coronal st · coronal · 0.97mm/px · 3 of 144 slices shown]
[im 48/144  soft-tissue]
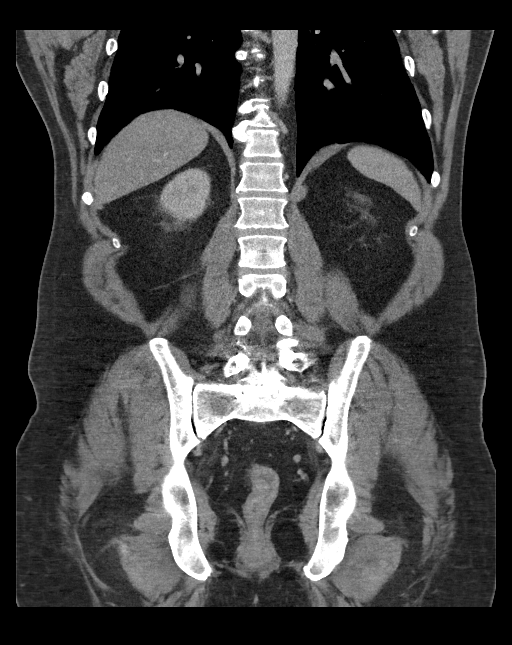
[im 64/144  soft-tissue]
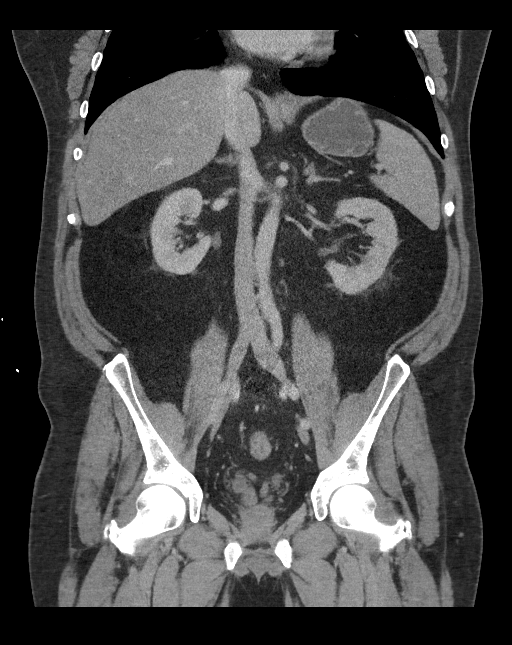
[im 80/144  soft-tissue]
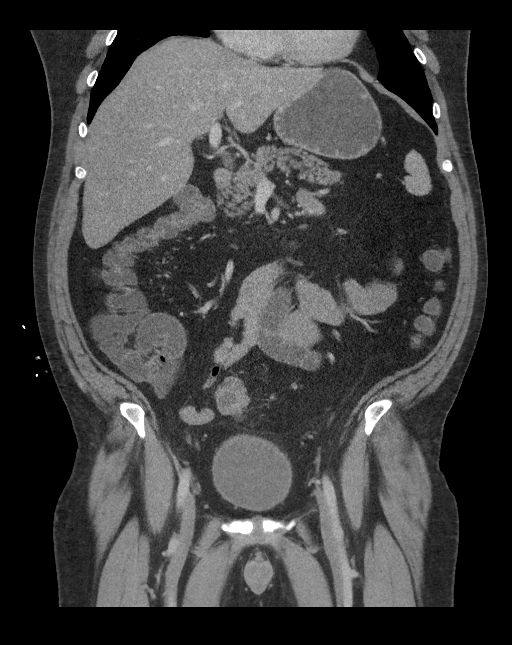

[16 of 46 positions shown; findings below may reference images not displayed]

FINDINGS: Lower chest: No acute abnormality.

Hepatobiliary: There is diffuse fatty infiltration of the liver
parenchyma. No focal liver abnormality is seen. No gallstones,
gallbladder wall thickening, or biliary dilatation.

Pancreas: Unremarkable. No pancreatic ductal dilatation or
surrounding inflammatory changes.

Spleen: Normal in size without focal abnormality.

Adrenals/Urinary Tract: Adrenal glands are unremarkable. Kidneys are
normal, without obstructing renal calculi, focal lesion, or
hydronephrosis. A 3 mm nonobstructing renal stone is seen within the
mid right kidney. Bladder is unremarkable.

Stomach/Bowel: Stomach is within normal limits. Appendix appears
normal. No evidence of bowel wall thickening, distention, or
inflammatory changes.

Vascular/Lymphatic: No significant vascular findings are present. No
enlarged abdominal or pelvic lymph nodes.

Reproductive: Prostate is unremarkable.

Other: No abdominal wall hernia or abnormality. No abdominopelvic
ascites.

Musculoskeletal: Bilateral metallic density pedicle screws are seen
at the levels of L5 and S1.
IMPRESSION: 1. Hepatic steatosis.
2. 3 mm nonobstructing renal stone within the right kidney.
3. Postoperative changes within the lower lumbar spine.

## 2022-08-07 DIAGNOSIS — I10 Essential (primary) hypertension: Secondary | ICD-10-CM | POA: Diagnosis not present

## 2022-08-07 DIAGNOSIS — E782 Mixed hyperlipidemia: Secondary | ICD-10-CM | POA: Diagnosis not present

## 2022-08-07 DIAGNOSIS — E291 Testicular hypofunction: Secondary | ICD-10-CM | POA: Diagnosis not present

## 2022-08-07 DIAGNOSIS — E1169 Type 2 diabetes mellitus with other specified complication: Secondary | ICD-10-CM | POA: Diagnosis not present

## 2022-08-26 DIAGNOSIS — F3342 Major depressive disorder, recurrent, in full remission: Secondary | ICD-10-CM | POA: Diagnosis not present

## 2022-09-05 ENCOUNTER — Other Ambulatory Visit (INDEPENDENT_AMBULATORY_CARE_PROVIDER_SITE_OTHER): Payer: BC Managed Care – PPO

## 2022-09-05 ENCOUNTER — Other Ambulatory Visit: Payer: Self-pay

## 2022-09-05 DIAGNOSIS — E039 Hypothyroidism, unspecified: Secondary | ICD-10-CM | POA: Diagnosis not present

## 2022-09-05 LAB — TSH: TSH: 2.9 u[IU]/mL (ref 0.35–5.50)

## 2022-09-05 LAB — T4, FREE: Free T4: 0.85 ng/dL (ref 0.60–1.60)

## 2022-09-10 ENCOUNTER — Encounter: Payer: Self-pay | Admitting: "Endocrinology

## 2022-09-10 ENCOUNTER — Ambulatory Visit: Payer: BC Managed Care – PPO | Admitting: "Endocrinology

## 2022-09-10 VITALS — BP 100/80 | HR 76 | Ht 72.0 in | Wt 227.4 lb

## 2022-09-10 DIAGNOSIS — E039 Hypothyroidism, unspecified: Secondary | ICD-10-CM | POA: Diagnosis not present

## 2022-09-10 DIAGNOSIS — Z6830 Body mass index (BMI) 30.0-30.9, adult: Secondary | ICD-10-CM

## 2022-09-10 DIAGNOSIS — E6609 Other obesity due to excess calories: Secondary | ICD-10-CM | POA: Diagnosis not present

## 2022-09-10 DIAGNOSIS — E291 Testicular hypofunction: Secondary | ICD-10-CM

## 2022-09-10 NOTE — Progress Notes (Signed)
Outpatient Endocrinology Note Altamese Binford, MD  09/10/22   Mark Good 1965-05-29 [id number]  Referring Provider: Lance Bosch, NP Primary Care Provider: Lance Bosch, NP Subjective  No chief complaint on file.   Assessment & Plan  Diagnoses and all orders for this visit:  Hypogonadism male -     Testosterone,Free and Total; Future -     Estradiol; Future -     Sex hormone binding globulin -     CBC -     PSA  Acquired hypothyroidism  Class 1 obesity due to excess calories with serious comorbidity and body mass index (BMI) of 30.0 to 30.9 in adult  Patient is referred for thyroid issues, however patient's want to discuss thyroid issues as well as testosterone issues Patient is currently taking levothyroxine 150 mcg, which was increased from 137 mcg Thyroid labs are WNL Continue recommended the same dose If did not benefit and fatigue, may add liothyronine 5 mcg daily to help with fatigue  Patient is currently on testosterone shots 200 mg / 1 mL every 2 weeks self injected Patient is not happy with his current testosterone levels due to testosterone levels in 300s and ongoing fatigue No history of low testosterone in the system to verify the diagnosis Instructed patient to hold the testosterone shot for 1 month followed by repeat labs at 8 AM, if labs are normal it would rule out hypogonadism, however if they are low we will repeat labs at 8 AM to confirm the diagnosis Will follow-up with dose adjustments accordingly  Discussed the role of obesity and decreasing testosterone Encouraged weight loss Patient has lost significant amount of weight on Ozempic, currently on 2 mg weekly  Maintain healthy lifestyle including 1500 Cal/day, 30 min of activity/day, avoiding refined/processed/outside food 20 minutes physical activity per day, in continuum or interruptedly through the day  Goals: less than 60 grams of carbohydrate/meal, 10,0000 steps a day and weight  loss of 0.5-1 lb/ wk  Sleep 7-9 hours/day, adapt good sleep hygiene Adapt de-stressing and relaxation techniques to prevent stress induced weight gain Avoid/switch medications that lead to weight gain by discussing with the prescribing physician    I have reviewed current medications, nurse's notes, allergies, vital signs, past medical and surgical history, family medical history, and social history for this encounter. Counseled patient on symptoms, examination findings, lab findings, imaging results, treatment decisions and monitoring and prognosis. The patient understood the recommendations and agrees with the treatment plan. All questions regarding treatment plan were fully answered.   Return in about 14 weeks (around 12/17/2022) for visit and 8 am labs before next visit.   Altamese [place], MD  09/10/22   I have reviewed current medications, nurse's notes, allergies, vital signs, past medical and surgical history, family medical history, and social history for this encounter. Counseled patient on symptoms, examination findings, lab findings, imaging results, treatment decisions and monitoring and prognosis. The patient understood the recommendations and agrees with the treatment plan. All questions regarding treatment plan were fully answered.   History of Present Illness Mark Good is a 57 y.o. year old male who presents to our clinic with abnormal TSH.  Has has thyroid issues since age of 82.  Patient c/o fatigue, which has been going on for 1-1/2-year.  Review self brought records 12/02/2021 Testosterone 236-> 335  (patient is not sure at what point during testosterone shots were these levels drawn, midcycle or before the next shot) Hb 13.7  Current symptoms  Fatigue yes weight gain No cold intolerance  No constipation  No  Compressive symptoms:  dysphagia  No dysphonia  No positional dyspnea (especially with simultaneous arms elevation)  No  Currently  taking levothyroxine 150 mcg daily  Physical Exam  BP 100/80   Pulse 76   Ht 6' (1.829 m)   Wt 227 lb 6.4 oz (103.1 kg)   SpO2 97%   BMI 30.84 kg/m   Physical Exam Constitutional:      Appearance: Normal appearance. Not ill-appearing.  HENT:     Head: Normocephalic and atraumatic.  Eyes:     General: No scleral icterus.    Conjunctiva/sclera: Conjunctivae normal.  Pulmonary:     Effort: Pulmonary effort is normal. No respiratory distress.  Musculoskeletal:     Cervical back: Normal range of motion. No rigidity.  Skin:    Coloration: Skin is not jaundiced.     Findings: No rash.  Neurological:     Mental Status: Alert and oriented to person, place, and time. Psychiatric:        Behavior: Behavior normal.        Judgment: Judgment normal.    Allergies Allergies  Allergen Reactions   Bupropion Other (See Comments)    Headaches   Codeine Itching and Rash    Current Medications Patient's Medications  New Prescriptions   No medications on file  Previous Medications   AMOXICILLIN (AMOXIL) 875 MG TABLET    Take 875 mg by mouth 2 (two) times daily.   ASPIRIN-ACETAMINOPHEN-CAFFEINE (GOODYS EXTRA STRENGTH PO)    Take 1 packet by mouth daily as needed (pain).    ATENOLOL (TENORMIN) 50 MG TABLET    Take 1 tablet (50 mg total) by mouth daily.   ATORVASTATIN (LIPITOR) 10 MG TABLET    Take 10 mg by mouth daily.   BUSPIRONE (BUSPAR) 15 MG TABLET    Take 15 mg by mouth 2 (two) times daily.   CHOLECALCIFEROL (VITAMIN D3) 2000 UNITS TABS    Take 1 tablet by mouth daily.   DULOXETINE (CYMBALTA) 30 MG CAPSULE    Take 3 times daily. Please discontinue all previous prescriptions.   DULOXETINE (CYMBALTA) 60 MG CAPSULE    Take 60 mg by mouth daily.   FLUTICASONE (FLONASE) 50 MCG/ACT NASAL SPRAY    Place 1 spray into both nostrils daily as needed for allergies or rhinitis.    FUROSEMIDE (LASIX) 20 MG TABLET    Take 20 mg by mouth every morning.   GABAPENTIN (NEURONTIN) 600 MG TABLET     Take 600 mg by mouth 2 (two) times daily. Reported on 05/10/2015   LEVOTHYROXINE (SYNTHROID) 137 MCG TABLET    Take 137 mcg by mouth daily before breakfast.   LEVOTHYROXINE (SYNTHROID) 150 MCG TABLET    Take 150 mcg by mouth daily.   LISINOPRIL (ZESTRIL) 10 MG TABLET    Take 10 mg by mouth daily.   METFORMIN (GLUCOPHAGE) 500 MG TABLET    Take 500 mg by mouth 2 (two) times daily with a meal.    METOPROLOL SUCCINATE (TOPROL-XL) 50 MG 24 HR TABLET    Take 50 mg by mouth daily.   MONTELUKAST (SINGULAIR) 10 MG TABLET    Take 10 mg by mouth at bedtime as needed.   NEOMYCIN-POLYMYXIN-HYDROCORTISONE (CORTISPORIN) 1 % SOLN OTIC SOLUTION    4 drops 4 (four) times daily.   OMEGA-3 ACID ETHYL ESTERS (LOVAZA) 1 G CAPSULE    Take 4 capsules by mouth daily.   OMEPRAZOLE (  PRILOSEC) 20 MG CAPSULE    Take 20 mg by mouth daily.   OVER THE COUNTER MEDICATION    Take 1 Dose by mouth daily. [employer] MEGA MEN ADV TESTOSTERONE TEST BOOSTER   PANTOPRAZOLE (PROTONIX) 40 MG TABLET    Take 40 mg by mouth daily.   TURMERIC (QC TUMERIC COMPLEX) 500 MG CAPS    Take 500 mg by mouth daily.   VITAMIN B-12 (CYANOCOBALAMIN) 1000 MCG TABLET    Take by mouth.  Modified Medications   No medications on file  Discontinued Medications   No medications on file    Past Medical History Past Medical History:  Diagnosis Date   Anxiety    Depression    GERD (gastroesophageal reflux disease)    Headache(784.0)    Hypertension    Hypothyroidism    Sleep apnea    uses cpap    Past Surgical History Past Surgical History:  Procedure Laterality Date   CARDIAC CATHETERIZATION     Dr Tresa Endo   KNEE ARTHROSCOPY Right    left leg repair of laceration     LUMBAR FUSION  09/23/2012   Dr Shon Baton   NASAL SEPTOPLASTY W/ TURBINOPLASTY      Family History family history includes Fibromyalgia in his father and mother; Other in his brother, brother, brother, and mother.  Social History Social History   Socioeconomic History   Marital  status: Divorced    Spouse name: Sonya   Number of children: 2   Years of education: Assoc   Highest education level: Not on file  Occupational History   Occupation: SERVICE ENGINEER TECH    Employer: [employer]  Tobacco Use   Smoking status: Never   Smokeless tobacco: Never  Vaping Use   Vaping Use: Never used  Substance and Sexual Activity   Alcohol use: Yes    Comment: drink once a month   Drug use: No   Sexual activity: Yes    Partners: Female  Other Topics Concern   Not on file  Social History Narrative   Patient lives at home with spouse.   Caffeine Use: 3 cups daily   Social Determinants of Health   Financial Resource Strain: Not on file  Food Insecurity: Not on file  Transportation Needs: Not on file  Physical Activity: Not on file  Stress: Not on file  Social Connections: Not on file  Intimate Partner Violence: Not on file    Laboratory Investigations Lab Results  Component Value Date   TSH 2.90 09/05/2022   TSH 4.131 02/12/2019   TSH 4.580 (H) 12/22/2013   FREET4 0.85 09/05/2022     No results found for: "TSI"   No components found for: "TRAB"   No results found for: "CHOL" No results found for: "HDL" No results found for: "LDLCALC" No results found for: "TRIG" No results found for: "CHOLHDL" Lab Results  Component Value Date   CREATININE 0.94 11/26/2020   No results found for: "GFR"    Component Value Date/Time   NA 135 11/26/2020 1949   K 4.4 11/26/2020 1949   CL 100 11/26/2020 1949   CO2 23 11/26/2020 1949   GLUCOSE 143 (H) 11/26/2020 1949   BUN 22 (H) 11/26/2020 1949   CREATININE 0.94 11/26/2020 1949   CALCIUM 9.3 11/26/2020 1949   PROT 8.3 (H) 11/26/2020 1949   ALBUMIN 4.7 11/26/2020 1949   AST 28 11/26/2020 1949   ALT 32 11/26/2020 1949   ALKPHOS 61 11/26/2020 1949  BILITOT 0.6 11/26/2020 1949   GFRNONAA >60 11/26/2020 1949   GFRAA >60 02/13/2019 0607      Latest Ref Rng & Units 11/26/2020    7:49 PM 02/13/2019     6:07 AM 02/12/2019    5:46 AM  BMP  Glucose 70 - 99 mg/dL 409  811  914   BUN 6 - 20 mg/dL 22  9  12    Creatinine 0.61 - 1.24 mg/dL 7.82  9.56  2.13   Sodium 135 - 145 mmol/L 135  131  124   Potassium 3.5 - 5.1 mmol/L 4.4  3.5  3.3   Chloride 98 - 111 mmol/L 100  94  91   CO2 22 - 32 mmol/L 23  25  21    Calcium 8.9 - 10.3 mg/dL 9.3  8.6  8.3        Component Value Date/Time   WBC 16.9 (H) 11/26/2020 1949   RBC 4.98 11/26/2020 1949   HGB 15.3 11/26/2020 1949   HCT 44.2 11/26/2020 1949   PLT 295 11/26/2020 1949   MCV 88.8 11/26/2020 1949   MCH 30.7 11/26/2020 1949   MCHC 34.6 11/26/2020 1949   RDW 12.9 11/26/2020 1949   LYMPHSABS 0.7 02/11/2019 1330   MONOABS 0.6 02/11/2019 1330   EOSABS 0.0 02/11/2019 1330   BASOSABS 0.0 02/11/2019 1330      Parts of this note may have been dictated using voice recognition software. There may be variances in spelling and vocabulary which are unintentional. Not all errors are proofread. Please notify the There[facility] Parkin if any discrepancies are noted or if the meaning of any statement is not clear.

## 2022-10-02 DIAGNOSIS — Z7984 Long term (current) use of oral hypoglycemic drugs: Secondary | ICD-10-CM | POA: Diagnosis not present

## 2022-10-02 DIAGNOSIS — H2513 Age-related nuclear cataract, bilateral: Secondary | ICD-10-CM | POA: Diagnosis not present

## 2022-10-02 DIAGNOSIS — E119 Type 2 diabetes mellitus without complications: Secondary | ICD-10-CM | POA: Diagnosis not present

## 2022-10-28 ENCOUNTER — Other Ambulatory Visit: Payer: BC Managed Care – PPO

## 2022-12-17 ENCOUNTER — Ambulatory Visit: Payer: BC Managed Care – PPO | Admitting: "Endocrinology

## 2023-01-20 DIAGNOSIS — H25043 Posterior subcapsular polar age-related cataract, bilateral: Secondary | ICD-10-CM | POA: Diagnosis not present

## 2023-01-20 DIAGNOSIS — H18413 Arcus senilis, bilateral: Secondary | ICD-10-CM | POA: Diagnosis not present

## 2023-01-20 DIAGNOSIS — H25013 Cortical age-related cataract, bilateral: Secondary | ICD-10-CM | POA: Diagnosis not present

## 2023-01-20 DIAGNOSIS — H2513 Age-related nuclear cataract, bilateral: Secondary | ICD-10-CM | POA: Diagnosis not present

## 2023-01-20 DIAGNOSIS — H2512 Age-related nuclear cataract, left eye: Secondary | ICD-10-CM | POA: Diagnosis not present

## 2023-02-16 DIAGNOSIS — F3342 Major depressive disorder, recurrent, in full remission: Secondary | ICD-10-CM | POA: Diagnosis not present

## 2023-03-16 DIAGNOSIS — H2512 Age-related nuclear cataract, left eye: Secondary | ICD-10-CM | POA: Diagnosis not present

## 2023-03-17 DIAGNOSIS — H2511 Age-related nuclear cataract, right eye: Secondary | ICD-10-CM | POA: Diagnosis not present

## 2023-03-17 DIAGNOSIS — H25011 Cortical age-related cataract, right eye: Secondary | ICD-10-CM | POA: Diagnosis not present

## 2023-03-17 DIAGNOSIS — H25041 Posterior subcapsular polar age-related cataract, right eye: Secondary | ICD-10-CM | POA: Diagnosis not present

## 2023-03-18 DIAGNOSIS — E1169 Type 2 diabetes mellitus with other specified complication: Secondary | ICD-10-CM | POA: Diagnosis not present

## 2023-03-18 DIAGNOSIS — Z23 Encounter for immunization: Secondary | ICD-10-CM | POA: Diagnosis not present

## 2023-03-18 DIAGNOSIS — E291 Testicular hypofunction: Secondary | ICD-10-CM | POA: Diagnosis not present

## 2023-03-18 DIAGNOSIS — E782 Mixed hyperlipidemia: Secondary | ICD-10-CM | POA: Diagnosis not present

## 2023-03-18 DIAGNOSIS — Z0000 Encounter for general adult medical examination without abnormal findings: Secondary | ICD-10-CM | POA: Diagnosis not present

## 2023-03-18 DIAGNOSIS — E039 Hypothyroidism, unspecified: Secondary | ICD-10-CM | POA: Diagnosis not present

## 2023-03-18 DIAGNOSIS — F419 Anxiety disorder, unspecified: Secondary | ICD-10-CM | POA: Diagnosis not present

## 2023-03-18 DIAGNOSIS — I10 Essential (primary) hypertension: Secondary | ICD-10-CM | POA: Diagnosis not present

## 2023-03-18 DIAGNOSIS — F330 Major depressive disorder, recurrent, mild: Secondary | ICD-10-CM | POA: Diagnosis not present

## 2023-03-18 DIAGNOSIS — Z125 Encounter for screening for malignant neoplasm of prostate: Secondary | ICD-10-CM | POA: Diagnosis not present

## 2023-04-13 DIAGNOSIS — H2511 Age-related nuclear cataract, right eye: Secondary | ICD-10-CM | POA: Diagnosis not present

## 2023-08-04 DIAGNOSIS — E291 Testicular hypofunction: Secondary | ICD-10-CM | POA: Diagnosis not present

## 2023-08-04 DIAGNOSIS — M79672 Pain in left foot: Secondary | ICD-10-CM | POA: Diagnosis not present

## 2023-08-04 DIAGNOSIS — E1169 Type 2 diabetes mellitus with other specified complication: Secondary | ICD-10-CM | POA: Diagnosis not present

## 2023-08-04 DIAGNOSIS — E039 Hypothyroidism, unspecified: Secondary | ICD-10-CM | POA: Diagnosis not present

## 2023-08-04 DIAGNOSIS — Z7984 Long term (current) use of oral hypoglycemic drugs: Secondary | ICD-10-CM | POA: Diagnosis not present

## 2023-08-04 DIAGNOSIS — E782 Mixed hyperlipidemia: Secondary | ICD-10-CM | POA: Diagnosis not present

## 2023-08-11 DIAGNOSIS — F3342 Major depressive disorder, recurrent, in full remission: Secondary | ICD-10-CM | POA: Diagnosis not present

## 2023-09-07 DIAGNOSIS — H9313 Tinnitus, bilateral: Secondary | ICD-10-CM | POA: Diagnosis not present

## 2023-09-07 DIAGNOSIS — H9319 Tinnitus, unspecified ear: Secondary | ICD-10-CM | POA: Diagnosis not present

## 2023-09-07 DIAGNOSIS — H9041 Sensorineural hearing loss, unilateral, right ear, with unrestricted hearing on the contralateral side: Secondary | ICD-10-CM | POA: Diagnosis not present

## 2023-10-30 ENCOUNTER — Emergency Department (HOSPITAL_BASED_OUTPATIENT_CLINIC_OR_DEPARTMENT_OTHER)
Admission: EM | Admit: 2023-10-30 | Discharge: 2023-10-30 | Disposition: A | Discharge status: Home / Self Care | Attending: Emergency Medicine | Admitting: Emergency Medicine

## 2023-10-30 ENCOUNTER — Encounter (HOSPITAL_BASED_OUTPATIENT_CLINIC_OR_DEPARTMENT_OTHER): Payer: Self-pay | Admitting: Emergency Medicine

## 2023-10-30 ENCOUNTER — Emergency Department (HOSPITAL_BASED_OUTPATIENT_CLINIC_OR_DEPARTMENT_OTHER)

## 2023-10-30 ENCOUNTER — Other Ambulatory Visit: Payer: Self-pay

## 2023-10-30 DIAGNOSIS — Z7982 Long term (current) use of aspirin: Secondary | ICD-10-CM | POA: Insufficient documentation

## 2023-10-30 DIAGNOSIS — S4991XA Unspecified injury of right shoulder and upper arm, initial encounter: Secondary | ICD-10-CM

## 2023-10-30 DIAGNOSIS — S59911A Unspecified injury of right forearm, initial encounter: Secondary | ICD-10-CM | POA: Diagnosis not present

## 2023-10-30 DIAGNOSIS — W311XXA Contact with metalworking machines, initial encounter: Secondary | ICD-10-CM | POA: Diagnosis not present

## 2023-10-30 DIAGNOSIS — Z23 Encounter for immunization: Secondary | ICD-10-CM | POA: Diagnosis not present

## 2023-10-30 DIAGNOSIS — Y92019 Unspecified place in single-family (private) house as the place of occurrence of the external cause: Secondary | ICD-10-CM | POA: Diagnosis not present

## 2023-10-30 DIAGNOSIS — S51831A Puncture wound without foreign body of right forearm, initial encounter: Secondary | ICD-10-CM | POA: Diagnosis not present

## 2023-10-30 MED ORDER — TETANUS-DIPHTH-ACELL PERTUSSIS 5-2.5-18.5 LF-MCG/0.5 IM SUSY
0.5000 mL | PREFILLED_SYRINGE | Freq: Once | INTRAMUSCULAR | Status: AC
  Administered 2023-10-30: 0.5 mL via INTRAMUSCULAR
  Filled 2023-10-30: qty 0.5

## 2023-10-30 NOTE — ED Notes (Signed)

## 2023-10-30 NOTE — Discharge Instructions (Signed)
 You were seen in the emergency department for an injury to your right forearm There are no broken bones There is a deep puncture wound but fortunately it does not appear as though there is any blood vessel involvement We irrigated the wound out thoroughly and provided an update to your tetanus Please keep the wound clean and dry and be on the look of present infection Have your primary care doctor reevaluate the wound next week Return to the Emergency Department for any signs of infection You should take pictures once a day so we can see how it is changing on reevaluation

## 2023-10-30 NOTE — ED Triage Notes (Signed)
 Pt reports dropping drill press on R forearm today while moving.

## 2023-10-30 NOTE — ED Provider Notes (Signed)
 [place] [facility] Provider Note   CSN: [id number] Arrival date & time: 10/30/23  1758     Patient presents with: Arm Injury   Mark Good is a 58 y.o. male.  Who presents to the ED for a forearm injury.  Patient was working at home with a heavy drill press which fell onto his right forearm.  He now has a puncture wound over the dorsal aspect of the proximal forearm.  No active bleeding.  No other injuries.  He is uncertain exactly when last tetanus was but thinks it was about 5 years ago.    Arm Injury      Prior to Admission medications   Medication Sig Start Date End Date Taking? Authorizing Provider  amoxicillin (AMOXIL) 875 MG tablet Take 875 mg by mouth 2 (two) times daily. Patient not taking: Reported on 09/10/2022 07/05/20   [provider]  Aspirin-Acetaminophen -Caffeine (GOODYS EXTRA STRENGTH PO) Take 1 packet by mouth daily as needed (pain).     [provider]  atenolol  (TENORMIN ) 50 MG tablet Take 1 tablet (50 mg total) by mouth daily. 02/14/19 03/16/19  Arlice Reichert, MD  atorvastatin (LIPITOR) 10 MG tablet Take 10 mg by mouth daily. 11/22/20   [provider]  busPIRone (BUSPAR) 15 MG tablet Take 15 mg by mouth 2 (two) times daily. Patient not taking: Reported on 09/10/2022 11/22/20   [provider]  Cholecalciferol (VITAMIN D3) 2000 UNITS TABS Take 1 tablet by mouth daily.    [provider]  DULoxetine  (CYMBALTA ) 30 MG capsule Take 3 times daily. Please discontinue all previous prescriptions. 04/07/17   Geralene Kaiser, MD  DULoxetine  (CYMBALTA ) 60 MG capsule Take 60 mg by mouth daily. 11/21/20   [provider]  fluticasone  (FLONASE ) 50 MCG/ACT nasal spray Place 1 spray into both nostrils daily as needed for allergies or rhinitis.  10/27/14   [provider]  furosemide (LASIX) 20 MG tablet Take 20 mg by mouth every morning. Patient not taking: Reported on 09/10/2022 11/10/20    [provider]  gabapentin  (NEURONTIN ) 600 MG tablet Take 600 mg by mouth 2 (two) times daily. Reported on 05/10/2015    [provider]  levothyroxine  (SYNTHROID ) 137 MCG tablet Take 137 mcg by mouth daily before breakfast. Patient not taking: Reported on 09/10/2022    [provider]  levothyroxine  (SYNTHROID ) 150 MCG tablet Take 150 mcg by mouth daily. 11/10/20   [provider]  lisinopril (ZESTRIL) 10 MG tablet Take 10 mg by mouth daily. 11/21/20   [provider]  metFORMIN (GLUCOPHAGE) 500 MG tablet Take 500 mg by mouth 2 (two) times daily with a meal.     [provider]  metoprolol succinate (TOPROL-XL) 50 MG 24 hr tablet Take 50 mg by mouth daily. Patient not taking: Reported on 09/10/2022 10/31/20   [provider]  montelukast (SINGULAIR) 10 MG tablet Take 10 mg by mouth at bedtime as needed. 11/11/20   [provider]  NEOMYCIN-POLYMYXIN-HYDROCORTISONE (CORTISPORIN) 1 % SOLN OTIC solution 4 drops 4 (four) times daily. Patient not taking: Reported on 09/10/2022 08/17/20   [provider]  omega-3 acid ethyl esters (LOVAZA) 1 g capsule Take 4 capsules by mouth daily. 09/04/20   [provider]  omeprazole (PRILOSEC) 20 MG capsule Take 20 mg by mouth daily.    [provider]  OVER THE COUNTER MEDICATION Take 1 Dose by mouth daily. G[employer] MEGA MEN ADV TESTOSTERONE  TEST BOOSTER  [provider]  pantoprazole  (PROTONIX ) 40 MG tablet Take 40 mg by mouth daily. Patient not taking: Reported on 09/10/2022    [provider]  Turmeric (QC TUMERIC COMPLEX) 500 MG CAPS Take 500 mg by mouth daily.    [provider]  vitamin B-12 (CYANOCOBALAMIN ) 1000 MCG tablet Take by mouth.    [provider]  lamoTRIgine  (LAMICTAL ) 100 MG tablet Take 1 tablet (100 mg total) by mouth daily. Patient not taking: Reported on 04/07/2017 12/09/16 04/07/17  Geralene Kaiser, MD    Allergies: Bupropion  and Codeine    Review of Systems  Updated Vital Signs BP (!) 143/91   Pulse 92   Temp 98 F (36.7 C) (Oral)   Resp 19   SpO2 98%   Physical Exam Vitals and nursing note reviewed.  HENT:     Head: Normocephalic and atraumatic.  Eyes:     Pupils: Pupils are equal, round, and reactive to light.  Cardiovascular:     Rate and Rhythm: Normal rate and regular rhythm.  Pulmonary:     Effort: Pulmonary effort is normal.     Breath sounds: Normal breath sounds.  Abdominal:     Palpations: Abdomen is soft.     Tenderness: There is no abdominal tenderness.  Skin:    General: Skin is warm and dry.     Comments: Puncture wound over dorsal aspect of proximal left forearm with surrounding skin avulsion irregularly shaped measuring approximately 3 x 4 cm No active bleeding 2+ radial pulses bilaterally Sensation intact light touch throughout right hand and forearm 5 out of 5 motor strength throughout right hand and forearm Compartments are soft  Neurological:     Mental Status: He is alert.  Psychiatric:        Mood and Affect: Mood normal.     (all labs ordered are listed, but only abnormal results are displayed) Labs Reviewed - No data to display  EKG: None  Radiology: DG Forearm Right Result Date: 10/30/2023 CLINICAL DATA:  Crush injury to the right forearm. EXAM: RIGHT FOREARM - 2 VIEW COMPARISON:  None Available. FINDINGS: Small olecranon spur. Mild ulna minus deformity. Radius and ulna appear intact. No evidence of acute fracture or dislocation. No focal bone lesion or bone destruction identified. Soft tissues are unremarkable. No radiopaque soft tissue foreign bodies or soft tissue gas. IMPRESSION: No acute bony abnormalities.  The Electronically Signed   By: Elsie Gravely M.D.   On: 10/30/2023 18:48     Procedures   Medications Ordered in the ED  Tdap (BOOSTRIX ) injection 0.5 mL (0.5 mLs Intramuscular Given 10/30/23 1917)                                     Medical Decision Making 58 year old male presenting for right forearm injury.  Heavy drill press fell onto the right forearm.  No osseous injury on x-ray.  Exam notable for some soft tissue swelling.  There is a deep puncture wound but no active bleeding.  Compartments are soft.  Low suspicion for compartment syndrome.  Low suspicion for neurovascular injury.  I thoroughly irrigated with normal saline/Betadine and it was dressed with nonadherent gauze and wrapped with gauze dressing.  He will follow-up with his PCP and be on the look o out for signs of infection and compartment syndrome.  Appropriate for discharge with PCP follow-up  Amount and/or Complexity of Data Reviewed  Radiology: ordered.  Risk Prescription drug management.        Final diagnoses:  Injury of right upper extremity, initial encounter    ED Discharge Orders     None          Pamella Ozell LABOR, DO 10/30/23 1950

## 2023-10-30 NOTE — ED Notes (Signed)
 EDP is irrigating the wound and will apply a celox impregnated gauze over the top and keep the wound open.

## 2023-11-03 DIAGNOSIS — M79675 Pain in left toe(s): Secondary | ICD-10-CM | POA: Diagnosis not present

## 2023-11-03 DIAGNOSIS — M79602 Pain in left arm: Secondary | ICD-10-CM | POA: Diagnosis not present

## 2023-11-03 DIAGNOSIS — I10 Essential (primary) hypertension: Secondary | ICD-10-CM | POA: Diagnosis not present

## 2023-11-03 DIAGNOSIS — E291 Testicular hypofunction: Secondary | ICD-10-CM | POA: Diagnosis not present

## 2023-11-03 DIAGNOSIS — E039 Hypothyroidism, unspecified: Secondary | ICD-10-CM | POA: Diagnosis not present

## 2024-01-14 DIAGNOSIS — E1169 Type 2 diabetes mellitus with other specified complication: Secondary | ICD-10-CM | POA: Diagnosis not present

## 2024-01-14 DIAGNOSIS — E039 Hypothyroidism, unspecified: Secondary | ICD-10-CM | POA: Diagnosis not present

## 2024-01-14 DIAGNOSIS — E291 Testicular hypofunction: Secondary | ICD-10-CM | POA: Diagnosis not present

## 2024-01-14 DIAGNOSIS — M25551 Pain in right hip: Secondary | ICD-10-CM | POA: Diagnosis not present

## 2024-01-14 DIAGNOSIS — I10 Essential (primary) hypertension: Secondary | ICD-10-CM | POA: Diagnosis not present

## 2024-02-03 DIAGNOSIS — F3342 Major depressive disorder, recurrent, in full remission: Secondary | ICD-10-CM | POA: Diagnosis not present
